# Patient Record
Sex: Female | Born: 1975 | Race: White | Hispanic: No | Marital: Single | Smoking: Never smoker
Health system: Southern US, Community
[De-identification: ages and names within clinical notes are randomized; demographics above are authoritative.]

## PROBLEM LIST (undated history)

## (undated) DIAGNOSIS — F329 Major depressive disorder, single episode, unspecified: Secondary | ICD-10-CM

## (undated) DIAGNOSIS — R7303 Prediabetes: Secondary | ICD-10-CM

## (undated) DIAGNOSIS — T7840XA Allergy, unspecified, initial encounter: Secondary | ICD-10-CM

## (undated) DIAGNOSIS — F32A Depression, unspecified: Secondary | ICD-10-CM

## (undated) DIAGNOSIS — F419 Anxiety disorder, unspecified: Secondary | ICD-10-CM

## (undated) HISTORY — DX: Allergy, unspecified, initial encounter: T78.40XA

## (undated) HISTORY — PX: BUNIONECTOMY: SHX129

## (undated) HISTORY — DX: Major depressive disorder, single episode, unspecified: F32.9

## (undated) HISTORY — DX: Anxiety disorder, unspecified: F41.9

## (undated) HISTORY — DX: Depression, unspecified: F32.A

---

## 1999-01-31 ENCOUNTER — Emergency Department (HOSPITAL_COMMUNITY): Admission: EM | Admit: 1999-01-31 | Discharge: 1999-01-31 | Payer: Self-pay | Admitting: Emergency Medicine

## 2002-01-02 ENCOUNTER — Other Ambulatory Visit: Admission: RE | Admit: 2002-01-02 | Discharge: 2002-01-02 | Payer: Self-pay | Admitting: Gynecology

## 2003-01-22 ENCOUNTER — Other Ambulatory Visit: Admission: RE | Admit: 2003-01-22 | Discharge: 2003-01-22 | Payer: Self-pay | Admitting: Gynecology

## 2004-09-16 ENCOUNTER — Other Ambulatory Visit: Admission: RE | Admit: 2004-09-16 | Discharge: 2004-09-16 | Payer: Self-pay | Admitting: Gynecology

## 2007-08-10 ENCOUNTER — Other Ambulatory Visit: Admission: RE | Admit: 2007-08-10 | Discharge: 2007-08-10 | Payer: Self-pay | Admitting: Gynecology

## 2010-07-02 ENCOUNTER — Encounter: Payer: PRIVATE HEALTH INSURANCE | Attending: Obstetrics and Gynecology

## 2010-07-02 DIAGNOSIS — O99810 Abnormal glucose complicating pregnancy: Secondary | ICD-10-CM | POA: Insufficient documentation

## 2010-07-02 DIAGNOSIS — Z713 Dietary counseling and surveillance: Secondary | ICD-10-CM | POA: Insufficient documentation

## 2010-07-07 ENCOUNTER — Inpatient Hospital Stay (HOSPITAL_COMMUNITY)
Admission: AD | Admit: 2010-07-07 | Discharge: 2010-07-07 | Disposition: A | Discharge status: Home / Self Care | Payer: PRIVATE HEALTH INSURANCE | Source: Ambulatory Visit | Attending: Obstetrics and Gynecology | Admitting: Obstetrics and Gynecology

## 2010-07-07 DIAGNOSIS — O4700 False labor before 37 completed weeks of gestation, unspecified trimester: Secondary | ICD-10-CM | POA: Insufficient documentation

## 2010-07-07 LAB — WET PREP, GENITAL
Clue Cells Wet Prep HPF POC: NONE SEEN
Trich, Wet Prep: NONE SEEN
Yeast Wet Prep HPF POC: NONE SEEN

## 2010-07-07 LAB — URINALYSIS, ROUTINE W REFLEX MICROSCOPIC
Ketones, ur: NEGATIVE mg/dL
Protein, ur: NEGATIVE mg/dL
Urine Glucose, Fasting: NEGATIVE mg/dL
Urobilinogen, UA: 0.2 mg/dL (ref 0.0–1.0)

## 2010-07-07 LAB — URINE MICROSCOPIC-ADD ON

## 2010-07-08 ENCOUNTER — Inpatient Hospital Stay (HOSPITAL_COMMUNITY)
Admission: AD | Admit: 2010-07-08 | Discharge: 2010-07-08 | Disposition: A | Discharge status: Home / Self Care | Payer: PRIVATE HEALTH INSURANCE | Source: Ambulatory Visit | Attending: Obstetrics and Gynecology | Admitting: Obstetrics and Gynecology

## 2010-07-08 DIAGNOSIS — O4700 False labor before 37 completed weeks of gestation, unspecified trimester: Secondary | ICD-10-CM | POA: Insufficient documentation

## 2010-07-09 LAB — URINE CULTURE: Colony Count: 5000

## 2010-07-11 ENCOUNTER — Encounter (HOSPITAL_COMMUNITY): Payer: Self-pay | Admitting: Radiology

## 2010-07-11 ENCOUNTER — Inpatient Hospital Stay (HOSPITAL_COMMUNITY): Payer: PRIVATE HEALTH INSURANCE

## 2010-07-11 ENCOUNTER — Inpatient Hospital Stay (HOSPITAL_COMMUNITY)
Admission: AD | Admit: 2010-07-11 | Discharge: 2010-07-15 | DRG: 781 | Disposition: A | Discharge status: Home / Self Care | Payer: PRIVATE HEALTH INSURANCE | Source: Ambulatory Visit | Attending: Obstetrics and Gynecology | Admitting: Obstetrics and Gynecology

## 2010-07-11 DIAGNOSIS — O4700 False labor before 37 completed weeks of gestation, unspecified trimester: Secondary | ICD-10-CM | POA: Diagnosis present

## 2010-07-11 DIAGNOSIS — O30049 Twin pregnancy, dichorionic/diamniotic, unspecified trimester: Secondary | ICD-10-CM

## 2010-07-11 DIAGNOSIS — O30009 Twin pregnancy, unspecified number of placenta and unspecified number of amniotic sacs, unspecified trimester: Principal | ICD-10-CM | POA: Diagnosis present

## 2010-07-11 DIAGNOSIS — O99891 Other specified diseases and conditions complicating pregnancy: Secondary | ICD-10-CM | POA: Diagnosis present

## 2010-07-11 DIAGNOSIS — O99810 Abnormal glucose complicating pregnancy: Secondary | ICD-10-CM | POA: Diagnosis present

## 2010-07-11 DIAGNOSIS — Z22330 Carrier of Group B streptococcus: Secondary | ICD-10-CM

## 2010-07-11 LAB — GLUCOSE, CAPILLARY: Glucose-Capillary: 109 mg/dL — ABNORMAL HIGH (ref 70–99)

## 2010-07-12 LAB — DIFFERENTIAL
Eosinophils Absolute: 0.1 10*3/uL (ref 0.0–0.7)
Eosinophils Relative: 1 % (ref 0–5)
Lymphs Abs: 1.9 10*3/uL (ref 0.7–4.0)
Monocytes Absolute: 0.7 10*3/uL (ref 0.1–1.0)
Monocytes Relative: 8 % (ref 3–12)
Neutrophils Relative %: 69 % (ref 43–77)

## 2010-07-12 LAB — CBC
MCH: 31.1 pg (ref 26.0–34.0)
MCHC: 33.6 g/dL (ref 30.0–36.0)
MCV: 92.5 fL (ref 78.0–100.0)
Platelets: 232 10*3/uL (ref 150–400)
RBC: 3.73 MIL/uL — ABNORMAL LOW (ref 3.87–5.11)

## 2010-07-12 LAB — URINE MICROSCOPIC-ADD ON

## 2010-07-12 LAB — BASIC METABOLIC PANEL
BUN: 12 mg/dL (ref 6–23)
CO2: 23 mEq/L (ref 19–32)
Calcium: 9.2 mg/dL (ref 8.4–10.5)
Chloride: 105 mEq/L (ref 96–112)
Creatinine, Ser: 0.48 mg/dL (ref 0.4–1.2)

## 2010-07-12 LAB — GLUCOSE, CAPILLARY
Glucose-Capillary: 80 mg/dL (ref 70–99)
Glucose-Capillary: 97 mg/dL (ref 70–99)
Glucose-Capillary: 117 mg/dL — ABNORMAL HIGH (ref 70–99)
Glucose-Capillary: 114 mg/dL — ABNORMAL HIGH (ref 70–99)

## 2010-07-12 LAB — URINALYSIS, ROUTINE W REFLEX MICROSCOPIC
Glucose, UA: NEGATIVE mg/dL
Hgb urine dipstick: NEGATIVE
Ketones, ur: NEGATIVE mg/dL
Protein, ur: NEGATIVE mg/dL

## 2010-07-13 LAB — URINE CULTURE
Colony Count: 5000
Culture  Setup Time: 201203031905
Special Requests: NEGATIVE

## 2010-07-14 LAB — GLUCOSE, CAPILLARY: Glucose-Capillary: 114 mg/dL — ABNORMAL HIGH (ref 70–99)

## 2010-07-15 LAB — GLUCOSE, CAPILLARY
Glucose-Capillary: 75 mg/dL (ref 70–99)
Glucose-Capillary: 84 mg/dL (ref 70–99)

## 2010-07-17 LAB — STREP B DNA PROBE

## 2010-07-21 ENCOUNTER — Inpatient Hospital Stay (HOSPITAL_COMMUNITY)
Admission: AD | Admit: 2010-07-21 | Discharge: 2010-07-21 | Disposition: A | Discharge status: Home / Self Care | Payer: PRIVATE HEALTH INSURANCE | Source: Ambulatory Visit | Attending: Obstetrics and Gynecology | Admitting: Obstetrics and Gynecology

## 2010-07-21 DIAGNOSIS — O4700 False labor before 37 completed weeks of gestation, unspecified trimester: Secondary | ICD-10-CM | POA: Insufficient documentation

## 2010-07-21 LAB — URINALYSIS, ROUTINE W REFLEX MICROSCOPIC
Glucose, UA: NEGATIVE mg/dL
Ketones, ur: NEGATIVE mg/dL
Nitrite: NEGATIVE
Protein, ur: NEGATIVE mg/dL
pH: 5.5 (ref 5.0–8.0)

## 2010-07-21 LAB — URINE MICROSCOPIC-ADD ON

## 2010-08-05 ENCOUNTER — Inpatient Hospital Stay (HOSPITAL_COMMUNITY): Payer: PRIVATE HEALTH INSURANCE

## 2010-08-05 ENCOUNTER — Inpatient Hospital Stay (HOSPITAL_COMMUNITY)
Admission: AD | Admit: 2010-08-05 | Discharge: 2010-08-10 | DRG: 767 | Disposition: A | Discharge status: Home / Self Care | Payer: PRIVATE HEALTH INSURANCE | Source: Ambulatory Visit | Attending: Obstetrics and Gynecology | Admitting: Obstetrics and Gynecology

## 2010-08-05 DIAGNOSIS — O9903 Anemia complicating the puerperium: Secondary | ICD-10-CM | POA: Diagnosis not present

## 2010-08-05 DIAGNOSIS — Z22330 Carrier of Group B streptococcus: Secondary | ICD-10-CM

## 2010-08-05 DIAGNOSIS — O99892 Other specified diseases and conditions complicating childbirth: Secondary | ICD-10-CM | POA: Diagnosis present

## 2010-08-05 DIAGNOSIS — D649 Anemia, unspecified: Secondary | ICD-10-CM | POA: Diagnosis not present

## 2010-08-05 DIAGNOSIS — O4590 Premature separation of placenta, unspecified, unspecified trimester: Principal | ICD-10-CM | POA: Diagnosis present

## 2010-08-05 DIAGNOSIS — O328XX0 Maternal care for other malpresentation of fetus, not applicable or unspecified: Secondary | ICD-10-CM | POA: Diagnosis present

## 2010-08-05 DIAGNOSIS — O30009 Twin pregnancy, unspecified number of placenta and unspecified number of amniotic sacs, unspecified trimester: Secondary | ICD-10-CM | POA: Diagnosis present

## 2010-08-05 DIAGNOSIS — O99814 Abnormal glucose complicating childbirth: Secondary | ICD-10-CM | POA: Diagnosis present

## 2010-08-05 LAB — CBC
HCT: 33.8 % — ABNORMAL LOW (ref 36.0–46.0)
Hemoglobin: 11.5 g/dL — ABNORMAL LOW (ref 12.0–15.0)
MCV: 91.1 fL (ref 78.0–100.0)
RBC: 3.71 MIL/uL — ABNORMAL LOW (ref 3.87–5.11)
WBC: 7.9 10*3/uL (ref 4.0–10.5)

## 2010-08-05 LAB — COMPREHENSIVE METABOLIC PANEL
ALT: 13 U/L (ref 0–35)
Alkaline Phosphatase: 106 U/L (ref 39–117)
BUN: 10 mg/dL (ref 6–23)
CO2: 24 mEq/L (ref 19–32)
Chloride: 105 mEq/L (ref 96–112)
GFR calc non Af Amer: >60 mL/min (ref >60)
Glucose, Bld: 92 mg/dL (ref 70–99)
Potassium: 4 mEq/L (ref 3.5–5.1)
Sodium: 134 mEq/L — ABNORMAL LOW (ref 135–145)
Total Bilirubin: 0.3 mg/dL (ref 0.3–1.2)
Total Protein: 5.1 g/dL — ABNORMAL LOW (ref 6.0–8.3)

## 2010-08-05 LAB — GLUCOSE, CAPILLARY: Glucose-Capillary: 119 mg/dL — ABNORMAL HIGH (ref 70–99)

## 2010-08-05 LAB — AMNISURE RUPTURE OF MEMBRANE (ROM) NOT AT ARMC: Amnisure ROM: NEGATIVE

## 2010-08-06 LAB — GLUCOSE, CAPILLARY: Glucose-Capillary: 86 mg/dL (ref 70–99)

## 2010-08-06 LAB — COMPREHENSIVE METABOLIC PANEL
Alkaline Phosphatase: 105 U/L (ref 39–117)
BUN: 9 mg/dL (ref 6–23)
Creatinine, Ser: 0.56 mg/dL (ref 0.4–1.2)
Glucose, Bld: 69 mg/dL — ABNORMAL LOW (ref 70–99)
Potassium: 4.1 mEq/L (ref 3.5–5.1)
Total Bilirubin: 0.3 mg/dL (ref 0.3–1.2)
Total Protein: 4.6 g/dL — ABNORMAL LOW (ref 6.0–8.3)

## 2010-08-06 LAB — CBC
MCH: 31.1 pg (ref 26.0–34.0)
MCHC: 34.1 g/dL (ref 30.0–36.0)
MCV: 91.2 fL (ref 78.0–100.0)
Platelets: 174 10*3/uL (ref 150–400)
RBC: 3.63 MIL/uL — ABNORMAL LOW (ref 3.87–5.11)

## 2010-08-07 LAB — GLUCOSE, CAPILLARY: Glucose-Capillary: 66 mg/dL — ABNORMAL LOW (ref 70–99)

## 2010-08-08 ENCOUNTER — Other Ambulatory Visit: Payer: Self-pay | Admitting: Obstetrics and Gynecology

## 2010-08-08 ENCOUNTER — Inpatient Hospital Stay (HOSPITAL_COMMUNITY): Payer: PRIVATE HEALTH INSURANCE

## 2010-08-08 LAB — CBC
MCH: 31.2 pg (ref 26.0–34.0)
MCV: 91 fL (ref 78.0–100.0)
Platelets: 196 10*3/uL (ref 150–400)
RDW: 12.8 % (ref 11.5–15.5)

## 2010-08-09 LAB — CBC
HCT: 20.2 % — ABNORMAL LOW (ref 36.0–46.0)
MCH: 31.1 pg (ref 26.0–34.0)
MCHC: 34.2 g/dL (ref 30.0–36.0)
RDW: 12.8 % (ref 11.5–15.5)

## 2010-08-10 LAB — CBC
MCH: 30.4 pg (ref 26.0–34.0)
MCHC: 34.5 g/dL (ref 30.0–36.0)
MCV: 88.1 fL (ref 78.0–100.0)
Platelets: 172 10*3/uL (ref 150–400)
RDW: 14.5 % (ref 11.5–15.5)
WBC: 11.4 10*3/uL — ABNORMAL HIGH (ref 4.0–10.5)

## 2010-08-11 LAB — CROSSMATCH
ABO/RH(D): A POS
Unit division: 0
Unit division: 0

## 2010-08-12 ENCOUNTER — Encounter (HOSPITAL_COMMUNITY)
Admission: RE | Admit: 2010-08-12 | Discharge: 2010-08-12 | Disposition: A | Discharge status: Home / Self Care | Payer: PRIVATE HEALTH INSURANCE | Source: Ambulatory Visit | Attending: Obstetrics and Gynecology | Admitting: Obstetrics and Gynecology

## 2010-08-12 DIAGNOSIS — O923 Agalactia: Secondary | ICD-10-CM | POA: Insufficient documentation

## 2010-08-13 NOTE — Op Note (Signed)
NAME:  Suzanne Hanson, Suzanne Hanson NO.:  [id number]  MEDICAL RECORD NO.:  [id number]           PATIENT TYPE:  I  LOCATION:  9305                          FACILITY:  [facility]  PHYSICIAN:  Janine Limbo, M.D.DATE OF BIRTH:  04-Oct-1975  DATE OF PROCEDURE:  08/08/2010 DATE OF DISCHARGE:                              OPERATIVE REPORT   PREOPERATIVE DIAGNOSES: 1. A 34-week and 2-day gestation. 2. Twin gestation. 3. Vertex-vertex presentation. 4. Triplet reduction to twins. 5. Gestational diabetes. 6. Preterm labor. 7. Positive beta strep culture.  POSTOPERATIVE DIAGNOSES: 1. A 34-week and 2-day gestation. 2. Twin gestation. 3. Vertex-vertex presentation. 4. Triplet reduction to twins. 5. Gestational diabetes. 6. Preterm labor. 7. Positive beta strep culture. 8. Placental abruption. 9. Compound presentation of twin B. 10.Third-degree laceration. 11.Questionable retained products of conception.  PROCEDURE:1. Normal spontaneous vaginal delivery of a twin gestation. 2. Curettage with ultrasound guidance. 3. Repair of third-degree laceration.  SURGEON:  Janine Limbo, MD  FIRST ASSISTANT:  Sanda Klein, certified nurse midwife.  ANESTHETIC:  Spinal and local Xylocaine.  DISPOSITION:  The patient is a 35 year old female, gravida 1, para 0, who was admitted on August 05, 2010, with preterm labor symptoms.  The patient has been followed at the [facility] Gy[facility]cology [facility] of [facility] for women.  Her diagnoses are mentioned above in the preop section of this note.  The patient began to labor on July 22, 2010.  The patient's membranes were ruptured when she was 8 cm dilated.  She was noted to have meconium-stained amniotic fluid.  A long discussion was held with the patient and her partner about the vaginal delivery of twins.  The decision was made to deliver the baby's in the operating room for safety reasons.  We discussed the  possibility that a cesarean section may be required if the second infant was in any position other than vertex.  We also discussed the possibility of cesarean section if there was evidence of fetal stress.  When the patient became completely dilated, the patient was transferred to the operating room for delivery.  FINDINGS:  The first infant delivered was a 2078 grams female infant (Silus).  The Apgar scores were 8 at 1  minute and 8 at 5 minutes.  That infant was delivered from a vertex presentation.  The second infant delivered was a 2222 grams female infant Sales executive).  That infant was delivered from a compound presentation with the fetal hand presenting above the head.  The Apgar scores for that infant was 5 at 1 minute and 7 at 5 minutes.  The arterial cord blood pH was 6.92.  The venous cord blood pH was 7.05.  There was a placental abruption that we believe occurred between the delivery of the 2 infants.  The placenta was sent to pathology.  The patient was noted to have a third-degree laceration. Because the patient had a fetal reduction from triplets to twins, an ultrasound was obtained in the operating suite.  There was a question of retained material in addition to blood clots.  On uterine curettage, only blood clot and membranous tissue was  recovered.  PROCEDURE:  The patient was taken to the operating room for delivery of twins.  The patient was prepped and draped.  The mother was allowed to push until she successfully delivered the fetal head of twin A.  The mouth and nose were suctioned.  Remainder of the infant was delivered. The cord was clamped and cut, and infant was handed to the awaiting pediatric team.  Routine cord blood studies were obtained.  An ultrasound was performed and the second infant was noted to be in a vertex presentation.  Fetal heart tones were stable for the second infant.  The patient was allowed to push.  Once the fetal head was engaged in the pelvis,  the membranes were ruptured.  The amniotic fluid was clear for this infant.  The fetal hand was noted to be presenting above the fetal head.  The patient began having bleeding.  A fetal scalp electrode was placed and after a brief episode of bradycardia, the fetal heart tones stabilized.  The patient was allowed to push and the fetal hand retracted.  There was a question of a prolapsed cord, but with further evaluation, it was felt to be the cord from twin A.  The mother then successfully delivered twin B from a vertex presentation.  The cord was clamped and cut and the infant was handed to the waiting pediatric team.  Routine cord blood studies were obtained from twin B including blood gas studies.  The perineum was prepped and then the rectal sphincter was then held using Allis clamps.  Three interrupted sutures of 2-0 Vicryl were placed.  The perineal body and the fascia propria of the perineum were then closed using interrupted sutures of 2-0 Vicryl. The vaginal mucosa was closed using a running suture of 2-0 Vicryl and the perineum was closed using a subcuticular suture of 2-0 Vicryl.  An ultrasound was performed and there was a question of retained products. The decision was made to proceed with uterine curettage while the ultrasound team was in the delivery area.  For that reason, we elected to proceed with a spinal anesthetic for the patient comfort.  The patient was evaluated by the Anesthesia Department and appropriate consent was obtained.  A spinal anesthetic was given.  The patient's perineum and vagina was then prepped with multiple layers of Betadine and the patient was sterilely draped.  The operator changed gown and gloves.  A rectal exam was performed and there was noted to be a "button hole" at the capsule of the rectal sphincter.  The sutures were then cut.  The operator changed gloves.  The uterine curettage was performed using a banjo curette.  This was done on the  ultrasound guidance.  The cavity was noted to be clean at the end of our procedure.  Very little tissue was obtained.  Blood clots and membranes were removed.  We then used Allis clamps to once again hold the rectal sphincter muscle.  Three interrupted sutures of 2-0 Vicryl were placed.  The perineal body was then closed in layers and reinforced using interrupted sutures of 2-0 Vicryl.  The vaginal mucosa was closed using a running suture and locking suture of 2-0 Vicryl.  The perineal skin was closed using 3-0 Monocryl.  Hemostasis was noted to be adequate.  The patient was given 1 gram of Ancef.  A rectal exam was performed.  In this time, the perineal body was noted to be well approximated and there was no evidence of the "button  hole" that was previously detected.  The patient was returned to the supine position.  She was transported to the recovery room in stable condition.  The bladder was drained of urine prior to the curettage. Sponge and needle counts were correct.  The estimated blood loss for the entire procedure including the delivery was 600 mL.  The infants were taken to the neonatal intensive care nursery for observation.  The twin placenta and the curettings were sent to pathology for evaluation.     Janine Limbo, M.D.     AVS/MEDQ  D:  08/08/2010  T:  08/09/2010  Job:  [id number]  Electronically Signed by Kirkland Hun M.D. on 08/13/2010 11:16:49 AM

## 2010-08-25 NOTE — Discharge Summary (Signed)
NAME:  Suzanne Hanson, Suzanne Hanson NO.:  [id number]  MEDICAL RECORD NO.:  [id number]           PATIENT TYPE:  I  LOCATION:  9152                          [facility]ILITY:  [facility]  PHYSICIAN:  Hal Morales, M.D.DATE OF BIRTH:  [birth year]  DATE OF ADMISSION:  07/11/2010 DATE OF DISCHARGE:  07/15/2010                              DISCHARGE SUMMARY   CONSULTATIONS:  NICU.  PROCEDURES:  Ultrasound on July 11, 2010, to assess AFI and presentation for twins.  ADMITTING DIAGNOSES: 1. Intrauterine pregnancy at 30 weeks. 2. Dichorionic-diamniotic twins. 3. Preterm cervical changes. 4. Positive GBS. 5. History of positive fetal fibronectin. 6. Gestational diabetes, diet controlled. 7. Status post betamethasone course x2 after positive fetal     fibronectin recently.  DISCHARGE DIAGNOSES: 1. Intrauterine pregnancy at 31 weeks. 2. Dichorionic-diamniotic twins. 3. Preterm cervical changes and dilatation. 4. Contractions stable on Procardia 10 mg p.o. q.4 h around the clock. 5. The patient's mom flying in from [place] on the evening of     discharge to help pt at home with bedrest.  LABORATORY DATA:  The patient had a CBC with diff drawn on July 12, 2010, white count was normal at 8.7, hemoglobin 11.6, hematocrit 34.5, and platelets were 232.  Differential was within normal limits.  She also had routine chemistries drawn, and was within normal limits. Urinalysis was within normal limits with the exception of small leukocyte esterase & few epithelials on micro urinalysis.  Urine culture which was done on July 12, 2010, showed insignificant growth with 5000 colonies.  GBS of vaginal/rectal specimen was positive.  The patient is gestational diabetic and diet controlled.  CBGs were as followed:  She had fastings as well as 2-hour postprandials.  On the evening of July 11, 2010, at 2018 postprandial was 109.  On July 12, 2010, fasting at 7:21 a.m. was 80, 10:32 a.m. 97, 1451 of 117,  1945 of 114.  On July 13, 2010, at 07:26 fasting was 95 at 11:35 of 97, at 1718 of 109 and all of these were within normal limits.  On July 13, 2010, at 1943 of 110.  On July 14, 2010, fasting at 8:10 was 86, 11:05 equal to 94, at 1611 CBG was 117 and at 2109 it was 114, and on July 15, 2010, 09:20 a.m. fasting was 75 at 11:51 a.m. it was 84.  These remained all within normal limits despite the patient being recently status post betamethasone course.  HOSPITAL COURSE:  Ms. Bouie is a 35 year old gravida 1, para 0 who presented on the date of admission at 30 weeks and 3 days as a direct admit for early cervical changes; complained of some occasional cramps, no vaginal bleeding, good fetal activity x2, intact membranes.  Denied GI or respiratory complaints.  No PIH or UTI signs or symptoms.  She was a transfer of care from [facility] at 28 weeks, and pregnancy remarkable for: 1. IVF pregnancy, had started as a triplet pregnancy and did have induced     reduction to twins. 2. Di-di twins. 3. Anxiety and depression and on Celexa--stable on 30 mg p.o. daily. 4. Gestational diabetes, diet  controlled. 5. Positive GBS.  On admission, the patient's vital signs were stable.  Speculum exam and cervical exam were deferred.  She did have reactive fetal heart tracing x2, baby A was running in 130s and baby B 140s, had moderate variability x2, both with an occasional mild variable.  She did note some uterine irritability with the occasional contraction. Consult was made with Dr. Jaymes Graff.  The patient was admitted for bedrest and to begin Procardia for preterm contractions and continued observation for possible preterm labor.  At 11:00 a.m. on July 11, 2010, the patient was without complaints.  Toco at that point showed one to seven contractions per hour.  Procardia had been started at 10mg  p.o. q.6 h with continuous monitoring. Plan was to monitor closely. On July 13, 2010, at 2:30 p.m., the patient continued to be without complaints.  No leakage of fluid, no vaginal bleeding.  Toco showed contractions were 5-7 per hour. Cervix on that day was 3 cm, 80% effaced, and minus 1 station.  She did have VTE prophylaxis beginning on the date of admission which was July 11, 2010.  The patient had an ultrasound to assess the AFI x2 as well as presentation x2.  On that day baby A: AFI was subjectively within normal, largest pocket was 7.5 cm and cephalic presentation and was the lower fetus.  Heart rate 140.  Baby B had a heart rate of 142.  AFI subjectively within normal limits with largest pocket equal to 10.1 cm and baby B was transverse with head to maternal left.  Cervical length 0 cm and appeared dilated 2.5 cm, adnexa with no abnormalities visualized. On July 13, 2010, Dr. Normand Sloop did note that the patient was stable, so IV was heplocked.  She had been receiving some IV hydration and also had been on a carbohydrate modified diet.  Dr. Normand Sloop did order that the patient be allowed to go to childbirth class later on that day if remained stable.  On morning of July 14, 2010, on morning rounds, the patient was doing well.  She was aware of some mild cramping, positive fetal movement x2, no leakage of fluid or vaginal bleeding. Vital signs were stable.  Both fetal heart rates remained reactive, one variable noted with baby A that day.  Her contractions were approximately every 8 minutes throughout the night with some irritability.  Physical exam was within normal limits.  The patient remained on her Procardia at that time every 6 hours.  As the day progressed however, Dr. Estanislado Pandy did order to increase the Procardia to 10 mg p.o. every 4 hours, and that change occurred around 1305 p.m. when the patient had reported an increase in uterine activity.   On the morning of July 15, 2010, just before lunch, the patient was at 60 weeks' gestation.  She was without  complaints.  No increase in contractions, no vaginal bleeding or leakage of fluid, no increase in vaginal/rectal pressure, had good fetal movement x2, reported typically feeling 2 contractions per hour with some cramping, no lower extremity complaints.  No GI or respiratory complaints.  Vital signs were stable. She was afebrile.  CBGs as I mentioned earlier were all within normal limits.  Baby A was reactive.  No decels, moderate variability, running a baseline of 130.  Baby B 135 and reactive, moderate variability, no decels.  She typically had 2 -4 contractions per hour with occasional uterine irritability.  Her physical exam was within normal limits.   Cervical  exam on that morning of the 6th (March) was 3 cm 100% effaced, -1 station, vertex, bulging membranes behind the cervix.  Cervix was noted to be towards the patient's left, mid to posterior position; GBS culture was obtained at that time, (vaginal/rectal swab). After consultation with Dr. Dierdre Forth, as the morning progressed, Dr. Pennie Rushing did deem the patient to have received full benefit of the hospital stay and did discharge the patient home on full bedrest and in stable condition.  Preterm labor signs and symptoms were reviewed with the patient as well as any warning signs and symptoms to report including signs and symptoms of DVT, leakage of fluid, vaginal bleeding.  She was also instructed on fetal kick counts, and to follow up p.r.n. She was discharged home with a scheduled followup on July 17, 2010, at [facility].  DISCHARGE MEDICATIONS:  She was to continue the Procardia 10 mg p.o. q.4 hrs as well as her previous home meds which included the Celexa, one mg of folic acid, prenatal vitamin, and Tums as needed.     Candice Denny Levy, CNM   ______________________________ Hal Morales, M.D.    CHS/MEDQ  D:  08/16/2010  T:  08/16/2010  Job:  [id number]  Electronically Signed by Carolanne Grumbling CNM on 08/19/2010 09:44:57 PM Electronically Signed by Dierdre Forth M.D. on 08/24/2010 11:55:38 AM

## 2010-09-12 ENCOUNTER — Encounter (HOSPITAL_COMMUNITY)
Admission: RE | Admit: 2010-09-12 | Discharge: 2010-09-12 | Disposition: A | Discharge status: Home / Self Care | Payer: PRIVATE HEALTH INSURANCE | Source: Ambulatory Visit | Attending: Obstetrics and Gynecology | Admitting: Obstetrics and Gynecology

## 2010-09-12 DIAGNOSIS — O923 Agalactia: Secondary | ICD-10-CM | POA: Insufficient documentation

## 2010-10-20 NOTE — Discharge Summary (Signed)
NAME:  Suzanne Hanson, Suzanne Hanson NO.:  [id number]  MEDICAL RECORD NO.:  [id number]           PATIENT TYPE:  I  LOCATION:  9305                          [facility]  PHYSICIAN:  Janine Limbo, M.D.DATE OF BIRTH:  June 15, 1975  DATE OF ADMISSION:  08/05/2010 DATE OF DISCHARGE:  08/10/2010                              DISCHARGE SUMMARY   ADMISSION DIAGNOSES: 1. A 34-2-week intrauterine twin gestation, vertex-vertex     presentation. 2. Triplet pregnancy reduced to twins. 3. Gestational diabetes. 4. Preterm labor. 5. Group B streptococcus positive.  DISCHARGE DIAGNOSES: 1. A 34-2-week intrauterine twin gestation, vertex-vertex     presentation. 2. Triple pregnancy reduced to twins. 3. Gestational diabetes. 4. Preterm labor. 5. Group B streptococcus positive. 6. Placental abruption. 7. Compound presentation of twin B. 8. Third-degree laceration. 9. Questionable retained products of conception.  The patient was admitted on August 05, 2010, at 33 weeks and 6 days with twins for preterm labor.  Cervix was 4 cm, 100 and zero station.  She had received betamethasone series in February.  The patient's contractions stabilized on August 06, 2010, and August 07, 2010, on IV fluids and p.o. Procardia.  On August 06, 2010, fasting blood sugar was 74, 2-hour postprandials were between 96 and 119.  Fetal heart rates were categories 1 and 2.  On August 07, 2010, fasting blood sugar was 66, 2-hour postprandials were between 100 and 118.  Fetal heart rates were category 1.  On the night of August 07, 2010, the patient's contractions increased despite 2 rescue doses of Procardia.  By 3:40 a.m. on August 08, 2010, her cervix was 6, 100%, zero station, bulging bag.  She was transferred to Labor and Delivery for labor.  Vertex-vertex presentation was verified by bedside ultrasound.  At 5:35 a.m., she progressed to 6-7 cm, +1 station.  The patient was transferred to the operating room  for delivery in the event that C-section became necessary.    Baby A was delivered at 11:02 a.m., he was a 2078 g female without Apgars 8 and 8. He was delivered vertex presentation.  Baby B was born at 11:39 with a compound presenting fetal hand above the head, Apgars were 5 and 7. Shortly before delivery of baby B, the patient began having vaginal bleeding.  There was a brief episode of bradycardia.  Fetal scalp electrode was placed to better assess fetal heart rate.  The heart rate stabilized.  The patient pushed a few more times and delivered twin B. Dr. Stefano Gaul felt that the bleeding episode was due to an abruption between the birth of twin A and twin B.  After delivery, the patient was found to have a third-degree laceration which was repaired in the usual fashion.  He then became concerned that there were retained products of conception.  Decision was made to proceed with urine curettaged with ultrasound guidance.  The patient received spinal anesthesia.  Little tissue was obtained with curettage.  Blood clots and membranes were removed.  A third-degree repair was then assessed.  A buttonhole opening was found in the capsule of the rectal sphincter, so the third-degree repair  was removed and repeated at this time with a good closure.  The patient had a routine recovery and was sent to [facility] for routine postpartum care.    On postpartum day 1, the patient was doing well.  She denied dizziness.  Her hemoglobin had dropped to 6.9.  She was offered blood transfusion, but declined.  A few hours later, she reconsidered stating that she felt very tired.  She received 3 units of packed red cells that afternoon.  Following morning on postpartum day 2, she reported feeling much better.  She denied dizziness.  Her hemoglobin risen to 10.2.  She was pumping breast milk.  She was discharged home on August 10, 2010, in stable condition.  Discharge followup in 5-6 weeks at [facility].  DISCHARGE MEDICATIONS:  Ibuprofen.  DISCHARGE INSTRUCTIONS:  Per [facility] handout.  Labs during her stay were as follows:  On August 05, 2010, CBC showed white blood cell count of 7.9, hemoglobin 11.5, hematocrit 33.8, platelets of 190.  On August 06, 2010, CBC showed white blood cell count of 7.2, hemoglobin 11.3, hematocrit 33.1, and platelets 174.  On August 08, 2010, this was postdelivery, white blood cell count was 19.3, hemoglobin 9.7, hematocrit 28.3, and platelets 196.  On August 09, 2010, white blood cell count was 12.6, hemoglobin 6.9, hematocrit 20.2, and platelets 148.  On August 10, 2010, white blood cell count was 11.4, hemoglobin 10.2, hematocrit 29.6, and platelets 172.  On August 05, 2010, basic metabolic panel was essentially normal with a slightly low sodium of 134 and on August 06, 2010, basic metabolic panel also essentially normal with slightly low glucose of 69.  On postpartum day 1, fasting glucose was 92.  Ultrasound on August 05, 2010, showed twin A cephalic presentation, placenta right lateral above cervical os.  No placental abruption or previa identified.  Normal fluid.  BPP 8/8.  Baby B transverse with head to maternal right, placenta right lateral above cervical os.  No placental abruption or previa identified.  Fluid normal.  BPP 8/8.  On August 08, 2010, ultrasound showed baby A cephalic presentation, normal fluid, baby B cephalic presentation, normal fluid.     Dorathy Kinsman, CNM   ______________________________ Janine Limbo, M.D.    VS/MEDQ  D:  09/30/2010  T:  09/30/2010  Job:  [id number]  Electronically Signed by Dorathy Kinsman  on 10/17/2010 04:04:05 PM Electronically Signed by Kirkland Hun M.D. on 10/20/2010 03:45:06 PM

## 2012-04-05 ENCOUNTER — Ambulatory Visit (INDEPENDENT_AMBULATORY_CARE_PROVIDER_SITE_OTHER): Payer: BC Managed Care – PPO | Admitting: Family Medicine

## 2012-04-05 VITALS — BP 107/67 | HR 72 | Temp 98.4°F | Resp 16 | Ht 63.5 in | Wt 148.6 lb

## 2012-04-05 DIAGNOSIS — F329 Major depressive disorder, single episode, unspecified: Secondary | ICD-10-CM

## 2012-04-05 DIAGNOSIS — F419 Anxiety disorder, unspecified: Secondary | ICD-10-CM

## 2012-04-05 DIAGNOSIS — F411 Generalized anxiety disorder: Secondary | ICD-10-CM

## 2012-04-05 DIAGNOSIS — F32A Depression, unspecified: Secondary | ICD-10-CM

## 2012-04-05 DIAGNOSIS — F3289 Other specified depressive episodes: Secondary | ICD-10-CM

## 2012-04-05 MED ORDER — CITALOPRAM HYDROBROMIDE 10 MG PO TABS: 30.0000 mg | ORAL_TABLET | Freq: Every day | ORAL | Status: DC

## 2012-04-05 NOTE — Progress Notes (Signed)
[facility]:  Office Visit  Chief Complaint:  Chief Complaint  Patient presents with  . Medication Refill    celexa    HPI: Suzanne Hanson is a 36 y.o. female who complains of  celexa refill, has been on it for 7 years. Still looking for work. 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 Has 33 mos old twins, still nursing. Has been cleared with OB that celexa is ok for  lacatation.  No s/sx of depression. No SI/HI.   Past Medical History  Diagnosis Date  . Allergy   . Depression   . Diabetes mellitus without complication     gestational  . Anxiety    Past Surgical History  Procedure Date  . Bunionectomy    History   Social History  . Marital Status: Single    Spouse Name: N/A    Number of Children: N/A  . Years of Education: N/A   Social History Main Topics  . Smoking status: Never Smoker   . Smokeless tobacco: None  . Alcohol Use: Yes  . Drug Use: No  . Sexually Active: Yes   Other Topics Concern  . None   Social History Narrative  . None   Family History  Problem Relation Age of Onset  . Osteoporosis Mother   . Post-traumatic stress disorder Father   . Drug abuse Sister   . Epilepsy Brother   . Food Allergy Son   . Arthritis Maternal Grandmother   . Osteoporosis Maternal Grandmother   . Graves' disease Maternal Grandfather   . Cancer Maternal Grandfather   . Diabetes Paternal Grandfather   . Cancer Paternal Grandfather    No Known Allergies Prior to Admission medications   Medication Sig Start Date End Date Taking? Authorizing Provider  citalopram (CELEXA) 10 MG tablet Take 30 mg by mouth daily.   Yes Historical Provider, MD     ROS: The patient denies fevers, chills, night sweats, unintentional weight loss, chest pain, palpitations, wheezing, dyspnea on exertion, nausea, vomiting, abdominal pain, dysuria, hematuria, melena, numbness,  weakness, or tingling.  All other systems have been reviewed and were otherwise negative with the exception of those mentioned in the HPI and as above.    PHYSICAL EXAM: Filed Vitals:   04/05/12 1246  BP: 107/67  Pulse: 72  Temp: 98.4 F (36.9 C)  Resp: 16   Filed Vitals:   04/05/12 1246  Height: 5' 3.5" (1.613 m)  Weight: 148 lb 9.6 oz (67.405 kg)   Body mass index is 25.91 kg/(m^2).  General: Alert, no acute distress HEENT:  Normocephalic, atraumatic, oropharynx patent.  Cardiovascular:  Regular rate and rhythm, no rubs murmurs or gallops.  No Carotid bruits, radial pulse intact. No pedal edema.  Respiratory: Clear to auscultation bilaterally.  No wheezes, rales, or rhonchi.  No cyanosis, no use of accessory musculature GI: No organomegaly, abdomen is soft and non-tender, positive bowel sounds.  No masses. Skin: No rashes. Neurologic: Facial musculature symmetric. Psychiatric: Patient is appropriate throughout our interaction. Lymphatic: No cervical lymphadenopathy Musculoskeletal: Gait intact.   LABS: Results for orders placed during the hospital encounter of 08/05/10  AMNISURE RUPTURE OF MEMBRANE (ROM)      Component Value Range   Amnisure ROM NEGATIVE    GLUCOSE, CAPILLARY      Component Value Range   Glucose-Capillary 105 (*) 70 - 99 mg/dL  Comment 1 Notify RN     Comment 2 Documented in Chart    CBC      Component Value Range   WBC 7.9  4.0 - 10.5 K/uL   RBC 3.71 (*) 3.87 - 5.11 MIL/uL   Hemoglobin 11.5 (*) 12.0 - 15.0 g/dL   HCT 16.1 (*) 09.6 - 04.5 %   MCV 91.1  78.0 - 100.0 fL   MCH 31.0  26.0 - 34.0 pg   MCHC 34.0  30.0 - 36.0 g/dL   RDW 40.9  81.1 - 91.4 %   Platelets 190  150 - 400 K/uL  COMPREHENSIVE METABOLIC PANEL      Component Value Range   Sodium 134 (*) 135 - 145 mEq/L   Potassium 4.0  3.5 - 5.1 mEq/L   Chloride 105  96 - 112 mEq/L   CO2 24  19 - 32 mEq/L   Glucose, Bld 92  70 - 99 mg/dL   BUN 10  6 - 23 mg/dL   Creatinine, Ser 7.82   0.4 - 1.2 mg/dL   Calcium 8.9  8.4 - 95.6 mg/dL   Total Protein 5.1 (*) 6.0 - 8.3 g/dL   Albumin 2.3 (*) 3.5 - 5.2 g/dL   AST 18  0 - 37 U/L   ALT 13  0 - 35 U/L   Alkaline Phosphatase 106  39 - 117 U/L   Total Bilirubin 0.3  0.3 - 1.2 mg/dL   GFR calc non Af Amer >60  >60 mL/min   GFR calc Af Amer    >60 mL/min   Value: >60            The eGFR has been calculated     using the MDRD equation.     This calculation has not been     validated in all clinical     situations.     eGFR's persistently     <60 mL/min signify     possible Chronic Kidney Disease.  GLUCOSE, CAPILLARY      Component Value Range   Glucose-Capillary 119 (*) 70 - 99 mg/dL   Comment 1 Notify RN     Comment 2 Documented in Chart    CBC      Component Value Range   WBC 7.2  4.0 - 10.5 K/uL   RBC 3.63 (*) 3.87 - 5.11 MIL/uL   Hemoglobin 11.3 (*) 12.0 - 15.0 g/dL   HCT 21.3 (*) 08.6 - 57.8 %   MCV 91.2  78.0 - 100.0 fL   MCH 31.1  26.0 - 34.0 pg   MCHC 34.1  30.0 - 36.0 g/dL   RDW 46.9  62.9 - 52.8 %   Platelets 174  150 - 400 K/uL  GLUCOSE, CAPILLARY      Component Value Range   Glucose-Capillary 74  70 - 99 mg/dL  COMPREHENSIVE METABOLIC PANEL      Component Value Range   Sodium 136  135 - 145 mEq/L   Potassium 4.1  3.5 - 5.1 mEq/L   Chloride 108  96 - 112 mEq/L   CO2 22  19 - 32 mEq/L   Glucose, Bld 69 (*) 70 - 99 mg/dL   BUN 9  6 - 23 mg/dL   Creatinine, Ser 4.13  0.4 - 1.2 mg/dL   Calcium 8.7  8.4 - 24.4 mg/dL   Total Protein 4.6 (*) 6.0 - 8.3 g/dL   Albumin 2.2 (*) 3.5 - 5.2 g/dL   AST 16  0 - 37 U/L   ALT 14  0 - 35 U/L   Alkaline Phosphatase 105  39 - 117 U/L   Total Bilirubin 0.3  0.3 - 1.2 mg/dL   GFR calc non Af Amer >60  >60 mL/min   GFR calc Af Amer    >60 mL/min   Value: >60            The eGFR has been calculated     using the MDRD equation.     This calculation has not been     validated in all clinical     situations.     eGFR's persistently     <60 mL/min signify      possible Chronic Kidney Disease.  GLUCOSE, CAPILLARY      Component Value Range   Glucose-Capillary 86  70 - 99 mg/dL   Comment 1 Notify RN     Comment 2 Documented in Chart    GLUCOSE, CAPILLARY      Component Value Range   Glucose-Capillary 100 (*) 70 - 99 mg/dL   Comment 1 Notify RN     Comment 2 Documented in Chart    GLUCOSE, CAPILLARY      Component Value Range   Glucose-Capillary 118 (*) 70 - 99 mg/dL   Comment 1 Notify RN     Comment 2 Documented in Chart    GLUCOSE, CAPILLARY      Component Value Range   Glucose-Capillary 66 (*) 70 - 99 mg/dL  GLUCOSE, CAPILLARY      Component Value Range   Glucose-Capillary 111 (*) 70 - 99 mg/dL   Comment 1 Notify RN     Comment 2 Documented in Chart    GLUCOSE, CAPILLARY      Component Value Range   Glucose-Capillary 108 (*) 70 - 99 mg/dL  GLUCOSE, CAPILLARY      Component Value Range   Glucose-Capillary 101 (*) 70 - 99 mg/dL   Comment 1 Notify RN    GLUCOSE, CAPILLARY      Component Value Range   Glucose-Capillary 88  70 - 99 mg/dL  GLUCOSE, CAPILLARY      Component Value Range   Glucose-Capillary 99  70 - 99 mg/dL  CBC      Component Value Range   WBC 19.3 (*) 4.0 - 10.5 K/uL   RBC 3.11 (*) 3.87 - 5.11 MIL/uL   Hemoglobin 9.7 (*) 12.0 - 15.0 g/dL   HCT 16.1 (*) 09.6 - 04.5 %   MCV 91.0  78.0 - 100.0 fL   MCH 31.2  26.0 - 34.0 pg   MCHC 34.3  30.0 - 36.0 g/dL   RDW 40.9  81.1 - 91.4 %   Platelets 196  150 - 400 K/uL  CBC      Component Value Range   WBC 12.6 (*) 4.0 - 10.5 K/uL   RBC 2.22 (*) 3.87 - 5.11 MIL/uL   Hemoglobin   (*) 12.0 - 15.0 g/dL   Value: 6.9 DELTA CHECK NOTED REPEATED TO VERIFY CRITICAL RESULT CALLED TO, READ BACK BY AND VERIFIED WITH: A RHODES @ 0553 ON 08/09/11 BY K HUFFMAN   HCT 20.2 (*) 36.0 - 46.0 %   MCV 91.0  78.0 - 100.0 fL   MCH 31.1  26.0 - 34.0 pg   MCHC 34.2  30.0 - 36.0 g/dL   RDW 78.2  95.6 - 21.3 %   Platelets   (*) 150 - 400 K/uL   Value: 148  DELTA CHECK NOTED SPECIMEN CHECKED  FOR CLOTS REPEATED TO VERIFY  GLUCOSE, CAPILLARY      Component Value Range   Glucose-Capillary 92  70 - 99 mg/dL  CROSSMATCH      Component Value Range   ABO/RH(D) A POS     Antibody Screen NEG     Sample Expiration 08/12/2010     Unit Number [id number]     Blood Component Type RED CELLS,LR     Unit division 00     Status of Unit ISSUED,FINAL     Transfusion Status OK TO TRANSFUSE     Crossmatch Result Compatible     Unit Number [id number]     Blood Component Type RED CELLS,LR     Unit division 00     Status of Unit ISSUED,FINAL     Transfusion Status OK TO TRANSFUSE     Crossmatch Result Compatible     Unit Number [id number]     Blood Component Type RED CELLS,LR     Unit division 00     Status of Unit ISSUED,FINAL     Transfusion Status OK TO TRANSFUSE     Crossmatch Result Compatible    ABO/RH      Component Value Range   ABO/RH(D) A POS    CBC      Component Value Range   WBC 11.4 (*) 4.0 - 10.5 K/uL   RBC 3.36 (*) 3.87 - 5.11 MIL/uL   Hemoglobin   (*) 12.0 - 15.0 g/dL   Value: 59.5 DELTA CHECK NOTED REPEATED TO VERIFY POST TRANSFUSION   HCT 29.6 (*) 36.0 - 46.0 %   MCV 88.1  78.0 - 100.0 fL   MCH 30.4  26.0 - 34.0 pg   MCHC 34.5  30.0 - 36.0 g/dL   RDW 63.8  75.6 - 43.3 %   Platelets 172  150 - 400 K/uL     EKG/XRAY:   Primary read interpreted by Dr. Conley Rolls at [facility].   ASSESSMENT/PLAN: Encounter Diagnoses  Name Primary?  . Depression Yes  . Anxiety    Depressive sxs well controlled with celexa. Benefits outweigh risk. She has 76 month old twins. Denies any SI/HI. Refilled celexa 30 mg daily #5 Patient is aware that we will not refill prescription unless she returns in 6 months. Need to establish continuity before rx 12 months at a time.  F/u in 6 months or prn   Clements Toro PHUONG, DO 04/05/2012 1:16 PM

## 2012-04-28 ENCOUNTER — Ambulatory Visit (INDEPENDENT_AMBULATORY_CARE_PROVIDER_SITE_OTHER): Payer: BC Managed Care – PPO | Admitting: Emergency Medicine

## 2012-04-28 ENCOUNTER — Ambulatory Visit: Payer: BC Managed Care – PPO

## 2012-04-28 VITALS — BP 110/70 | HR 73 | Temp 98.4°F | Resp 16 | Ht 63.0 in | Wt 143.0 lb

## 2012-04-28 DIAGNOSIS — M25571 Pain in right ankle and joints of right foot: Secondary | ICD-10-CM

## 2012-04-28 DIAGNOSIS — M25579 Pain in unspecified ankle and joints of unspecified foot: Secondary | ICD-10-CM

## 2012-04-28 DIAGNOSIS — Z23 Encounter for immunization: Secondary | ICD-10-CM

## 2012-04-28 NOTE — Progress Notes (Signed)
   [address], [place] [address]   Phone [phone]  Subjective:    Patient ID: Suzanne Hanson, female    DOB: 1975-09-04, 36 y.o.   MRN: [id number]  HPI  Pt presents to clinic with R ankle pain for about 2 wks.  She runs about 6 miles a day and only had pain when she pushed on the area until Sunday  when she ran 7 miles then she started to develop constant pain and increased pain with ambulation. Prior to her run she had no injury to explain her pain. She tried to wear a short camwalker but it did not help the pain.  She is worried about a stress fracture.  She had one in her foot from running and this feels similar.  She has taken no meds.  She is not worried as much about the pain as she is about a possible fracture getting worse or not healing. Pt would like a flu vaccine. She is breastfeeding twins (74 months old)  Review of Systems  Musculoskeletal: Positive for gait problem (2nd to pain).       Objective:   Physical Exam  Vitals reviewed. Constitutional: She is oriented to person, place, and time. She appears well-developed and well-nourished.  HENT:  Head: Normocephalic and atraumatic.  Right Ear: External ear normal.  Left Ear: External ear normal.  Pulmonary/Chest: Effort normal.  Musculoskeletal:       Right ankle: Normal. She exhibits normal range of motion, no swelling, no ecchymosis, no deformity, no laceration and normal pulse. no tenderness. No lateral malleolus and no medial malleolus tenderness found.       Feet:  Neurological: She is alert and oriented to person, place, and time.  Skin: Skin is warm and dry.  Psychiatric: She has a normal mood and affect. Her behavior is normal. Judgment and thought content normal.   [facility] reading (PRIMARY) by  Dr. Cleta Alberts. Normal - no signs of fracture.     Assessment & Plan:   1. Ankle pain, right  DG Tibia/Fibula Right  2. Flu vaccine need  Flu vaccine greater than or equal to 3yo preservative free IM   There is  concern for a stress fracture of the distal fibula but it is not obvious on xray, which is not unexpected.  D/w pt that we will treat as a fracture and then recheck xray in 2 wks to see if we can see callus formation around the tender area..   If still unimproved pain at that time will discuss an MRI but do not think necessary today and the patient agrees.  Will treat with tall camwalker until then.  Pt can use OTC NSAIDs if she needs them for pain.  She is ok to do non-weightbearing exercise.  D/w Dr Cleta Alberts.

## 2012-10-19 ENCOUNTER — Ambulatory Visit (INDEPENDENT_AMBULATORY_CARE_PROVIDER_SITE_OTHER): Payer: BC Managed Care – PPO | Admitting: Family Medicine

## 2012-10-19 VITALS — BP 109/71 | HR 78 | Temp 98.0°F | Resp 16 | Ht 63.5 in | Wt 152.6 lb

## 2012-10-19 DIAGNOSIS — F329 Major depressive disorder, single episode, unspecified: Secondary | ICD-10-CM

## 2012-10-19 DIAGNOSIS — F419 Anxiety disorder, unspecified: Secondary | ICD-10-CM

## 2012-10-19 DIAGNOSIS — F411 Generalized anxiety disorder: Secondary | ICD-10-CM

## 2012-10-19 MED ORDER — CITALOPRAM HYDROBROMIDE 10 MG PO TABS: 30.0000 mg | ORAL_TABLET | Freq: Every day | ORAL | Status: DC

## 2012-10-19 NOTE — Progress Notes (Signed)
  Subjective:    Patient ID: Suzanne Hanson, female    DOB: [birth year], 37 y.o.   MRN: [id number] Chief Complaint  Patient presents with  . Medication Refill   HPI  Suzanne Hanson is a delightful 37 yo who is here for a refill on her citalopram 30mg .  She initially started on it about 8 yrs ago when she was struggling with infertility. 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  She has been on the 30mg  dose for almost the whole time. She has thought about weaning off of it before but having several young children doesn't make it a great time and her partner notices big improvement with her on it.  Past Medical History  Diagnosis Date  . Allergy   . Depression   . Diabetes mellitus without complication     gestational  . Anxiety    No current outpatient prescriptions on file prior to visit.   No current facility-administered medications on file prior to visit.   No Known Allergies  Review of Systems  Constitutional: Negative for fever, chills, diaphoresis, activity change, appetite change, fatigue and unexpected weight change.  Cardiovascular: Negative for chest pain.  Gastrointestinal: Negative for nausea and vomiting.  Psychiatric/Behavioral: Positive for dysphoric mood. Negative for behavioral problems, confusion, sleep disturbance, decreased concentration and agitation. The patient is not nervous/anxious and is not hyperactive.       BP 109/71  Pulse 78  Temp(Src) 98 F (36.7 C) (Oral)  Resp 16  Ht 5' 3.5" (1.613 m)  Wt 152 lb 9.6 oz (69.219 kg)  BMI 26.6 kg/m2  SpO2 97%  LMP 10/05/2012 Objective:   Physical Exam  Constitutional: She is oriented to person, place, and time. She appears well-developed and well-nourished. No distress.  HENT:  Head: Normocephalic and atraumatic.  Right Ear: External ear normal.  Left Ear: External ear normal.  Eyes: Conjunctivae are normal. No scleral icterus.  Neck: Normal range of motion.  Neck supple. No thyromegaly present.  Cardiovascular: Normal rate, regular rhythm, normal heart sounds and intact distal pulses.   Pulmonary/Chest: Effort normal and breath sounds normal. No respiratory distress.  Musculoskeletal: She exhibits no edema.  Lymphadenopathy:    She has no cervical adenopathy.  Neurological: She is alert and oriented to person, place, and time.  Skin: Skin is warm and dry. She is not diaphoretic. No erythema.  Psychiatric: She has a normal mood and affect. Her behavior is normal.          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  Meds ordered this encounter  Medications  .               . citalopram (CELEXA) 10 MG tablet    Sig: Take 3 tablets (30 mg total) by mouth daily.    Dispense:  270 tablet    Refill:  3

## 2013-04-04 ENCOUNTER — Other Ambulatory Visit: Payer: Self-pay | Admitting: Internal Medicine

## 2013-04-04 DIAGNOSIS — R42 Dizziness and giddiness: Secondary | ICD-10-CM

## 2013-04-09 ENCOUNTER — Ambulatory Visit
Admission: RE | Admit: 2013-04-09 | Discharge: 2013-04-09 | Disposition: A | Discharge status: Home / Self Care | Payer: BC Managed Care – PPO | Source: Ambulatory Visit | Attending: Internal Medicine | Admitting: Internal Medicine

## 2013-04-09 DIAGNOSIS — R42 Dizziness and giddiness: Secondary | ICD-10-CM

## 2015-10-05 ENCOUNTER — Emergency Department (HOSPITAL_COMMUNITY)
Admission: EM | Admit: 2015-10-05 | Discharge: 2015-10-05 | Disposition: A | Discharge status: Other Acute Inpatient Hospital | Payer: BC Managed Care – PPO | Attending: Emergency Medicine | Admitting: Emergency Medicine

## 2015-10-05 ENCOUNTER — Inpatient Hospital Stay (HOSPITAL_COMMUNITY)
Admission: AD | Admit: 2015-10-05 | Discharge: 2015-10-09 | DRG: 885 | Disposition: A | Discharge status: Home / Self Care | Payer: BC Managed Care – PPO | Source: Intra-hospital | Attending: Psychiatry | Admitting: Psychiatry

## 2015-10-05 ENCOUNTER — Encounter (HOSPITAL_COMMUNITY): Payer: Self-pay | Admitting: *Deleted

## 2015-10-05 ENCOUNTER — Encounter (HOSPITAL_COMMUNITY): Payer: Self-pay | Admitting: Emergency Medicine

## 2015-10-05 DIAGNOSIS — Z833 Family history of diabetes mellitus: Secondary | ICD-10-CM

## 2015-10-05 DIAGNOSIS — F329 Major depressive disorder, single episode, unspecified: Secondary | ICD-10-CM | POA: Diagnosis not present

## 2015-10-05 DIAGNOSIS — F332 Major depressive disorder, recurrent severe without psychotic features: Secondary | ICD-10-CM | POA: Diagnosis present

## 2015-10-05 DIAGNOSIS — F419 Anxiety disorder, unspecified: Secondary | ICD-10-CM | POA: Diagnosis present

## 2015-10-05 DIAGNOSIS — Z820 Family history of epilepsy and other diseases of the nervous system: Secondary | ICD-10-CM

## 2015-10-05 DIAGNOSIS — G4700 Insomnia, unspecified: Secondary | ICD-10-CM | POA: Diagnosis present

## 2015-10-05 DIAGNOSIS — Z818 Family history of other mental and behavioral disorders: Secondary | ICD-10-CM | POA: Diagnosis not present

## 2015-10-05 DIAGNOSIS — Z8262 Family history of osteoporosis: Secondary | ICD-10-CM | POA: Diagnosis not present

## 2015-10-05 DIAGNOSIS — F32A Depression, unspecified: Secondary | ICD-10-CM

## 2015-10-05 DIAGNOSIS — R45851 Suicidal ideations: Secondary | ICD-10-CM

## 2015-10-05 DIAGNOSIS — Z809 Family history of malignant neoplasm, unspecified: Secondary | ICD-10-CM | POA: Diagnosis not present

## 2015-10-05 LAB — CBC WITH DIFFERENTIAL/PLATELET
BASOS ABS: 0.1 10*3/uL (ref 0.0–0.1)
BASOS PCT: 1 %
EOS PCT: 0 %
Eosinophils Absolute: 0 10*3/uL (ref 0.0–0.7)
HEMATOCRIT: 41.5 % (ref 36.0–46.0)
Hemoglobin: 14.1 g/dL (ref 12.0–15.0)
Lymphocytes Relative: 26 %
Lymphs Abs: 2.3 10*3/uL (ref 0.7–4.0)
MCH: 31.1 pg (ref 26.0–34.0)
MCHC: 34 g/dL (ref 30.0–36.0)
MCV: 91.4 fL (ref 78.0–100.0)
MONO ABS: 0.4 10*3/uL (ref 0.1–1.0)
MONOS PCT: 5 %
NEUTROS ABS: 6.1 10*3/uL (ref 1.7–7.7)
Neutrophils Relative %: 68 %
PLATELETS: 335 10*3/uL (ref 150–400)
RBC: 4.54 MIL/uL (ref 3.87–5.11)
RDW: 12.7 % (ref 11.5–15.5)
WBC: 8.9 10*3/uL (ref 4.0–10.5)

## 2015-10-05 LAB — RAPID URINE DRUG SCREEN, HOSP PERFORMED
Amphetamines: NOT DETECTED
BENZODIAZEPINES: NOT DETECTED
Barbiturates: NOT DETECTED
COCAINE: NOT DETECTED
Opiates: NOT DETECTED
Tetrahydrocannabinol: NOT DETECTED

## 2015-10-05 LAB — PREGNANCY, URINE: Preg Test, Ur: NEGATIVE

## 2015-10-05 LAB — COMPREHENSIVE METABOLIC PANEL
ALBUMIN: 5.2 g/dL — AB (ref 3.5–5.0)
ALK PHOS: 51 U/L (ref 38–126)
ALT: 21 U/L (ref 14–54)
AST: 22 U/L (ref 15–41)
Anion gap: 5 (ref 5–15)
BILIRUBIN TOTAL: 0.7 mg/dL (ref 0.3–1.2)
BUN: 16 mg/dL (ref 6–20)
CO2: 28 mmol/L (ref 22–32)
CREATININE: 0.81 mg/dL (ref 0.44–1.00)
Calcium: 9.4 mg/dL (ref 8.9–10.3)
Chloride: 105 mmol/L (ref 101–111)
GFR calc Af Amer: >60 mL/min (ref >60)
GLUCOSE: 107 mg/dL — AB (ref 65–99)
Potassium: 3.6 mmol/L (ref 3.5–5.1)
Sodium: 138 mmol/L (ref 135–145)
TOTAL PROTEIN: 8.1 g/dL (ref 6.5–8.1)

## 2015-10-05 LAB — ETHANOL

## 2015-10-05 MED ORDER — LORATADINE 10 MG PO TABS: 10.0000 mg | ORAL_TABLET | Freq: Every day | ORAL | Status: DC

## 2015-10-05 MED ORDER — DULOXETINE HCL 60 MG PO CPEP
60.0000 mg | ORAL_CAPSULE | Freq: Every day | ORAL | Status: DC
  Administered 2015-10-05: 60 mg via ORAL
  Filled 2015-10-05: qty 1

## 2015-10-05 MED ORDER — ALUM & MAG HYDROXIDE-SIMETH 200-200-20 MG/5ML PO SUSP: 30.0000 mL | ORAL | Status: DC | PRN

## 2015-10-05 MED ORDER — ACETAMINOPHEN 325 MG PO TABS
650.0000 mg | ORAL_TABLET | Freq: Four times a day (QID) | ORAL | Status: DC | PRN
  Administered 2015-10-07: 650 mg via ORAL
  Filled 2015-10-05: qty 2

## 2015-10-05 MED ORDER — LORATADINE 10 MG PO TABS
10.0000 mg | ORAL_TABLET | Freq: Every day | ORAL | Status: DC
  Administered 2015-10-06 – 2015-10-09 (×4): 10 mg via ORAL
  Filled 2015-10-05 (×6): qty 1

## 2015-10-05 MED ORDER — ZOLPIDEM TARTRATE 5 MG PO TABS: 5.0000 mg | ORAL_TABLET | Freq: Every evening | ORAL | Status: DC | PRN

## 2015-10-05 MED ORDER — IBUPROFEN 200 MG PO TABS: 600.0000 mg | ORAL_TABLET | Freq: Three times a day (TID) | ORAL | Status: DC | PRN

## 2015-10-05 MED ORDER — DULOXETINE HCL 60 MG PO CPEP
60.0000 mg | ORAL_CAPSULE | Freq: Every day | ORAL | Status: DC
  Filled 2015-10-05 (×3): qty 1

## 2015-10-05 MED ORDER — ACETAMINOPHEN 325 MG PO TABS: 650.0000 mg | ORAL_TABLET | ORAL | Status: DC | PRN

## 2015-10-05 MED ORDER — ONDANSETRON HCL 4 MG PO TABS: 4.0000 mg | ORAL_TABLET | Freq: Three times a day (TID) | ORAL | Status: DC | PRN

## 2015-10-05 MED ORDER — LORAZEPAM 1 MG PO TABS
1.0000 mg | ORAL_TABLET | Freq: Three times a day (TID) | ORAL | Status: DC | PRN
Start: 2015-10-05 — End: 2015-10-05
  Administered 2015-10-05: 1 mg via ORAL
  Filled 2015-10-05: qty 1

## 2015-10-05 NOTE — ED Notes (Signed)
Per patient, states she and her wife were discussing ending marriage-states she started having suicidal thoughts

## 2015-10-05 NOTE — Tx Team (Signed)
Initial Interdisciplinary Treatment Plan   PATIENT STRESSORS: Marital or family conflict Occupational concerns   PATIENT STRENGTHS: Ability for insight Active sense of humor Average or above average intelligence Capable of independent living General fund of knowledge Motivation for treatment/growth   PROBLEM LIST: Problem List/Patient Goals Date to be addressed Date deferred Reason deferred Estimated date of resolution  Depression 10/05/15     Suicidal thoughts 10/05/15     "Deciding what to do about my marriage and my kids, strategies I need to take to make me happy again" 10/05/15                                          DISCHARGE CRITERIA:  Ability to meet basic life and health needs Improved stabilization in mood, thinking, and/or behavior Verbal commitment to aftercare and medication compliance  PRELIMINARY DISCHARGE PLAN: Attend aftercare/continuing care group Return to previous living arrangement  PATIENT/FAMIILY INVOLVEMENT: This treatment plan has been presented to and reviewed with the patient, Suzanne Hanson, and/or family member, .  The patient and family have been given the opportunity to ask questions and make suggestions.  [name], [name] 10/05/2015, 11:00 PM

## 2015-10-05 NOTE — ED Notes (Signed)
TTS into see 

## 2015-10-05 NOTE — ED Notes (Signed)
Report from Janie Rambo RN. Patient sleeping, respirations regular and unlabored. Q15 minute rounds and security camera observation to continue.   

## 2015-10-05 NOTE — Progress Notes (Signed)
40 year old female pt admitted on voluntary basis. Pt reports some depression and recent suicidal thoughts and relates them to having relationship issues. Pt spoke about how she has a place to go when she leaves but is unsure if she wants to go back to the same situation. Pt denies any substance abuse issues and reports that she has been taking her medications as prescribed. Pt denied any current SI on admission and able to contract for safety on the unit. Pt was oriented to the unit and safety maintained.

## 2015-10-05 NOTE — ED Notes (Signed)
Patient noted in room. No complaints, stable, in no acute distress. Q15 minute rounds and monitoring via Security Cameras to continue.  

## 2015-10-05 NOTE — ED Notes (Signed)
Patient noted sleeping in room. No complaints, stable, in no acute distress. Q15 minute rounds and monitoring via Security Cameras to continue.  

## 2015-10-05 NOTE — ED Provider Notes (Signed)
CSN: [id number]     Arrival date & time 10/05/15  1102 History   First MD Initiated Contact with Patient 10/05/15 1124     Chief Complaint  Patient presents with  . Suicidal      HPI Patient has a history of depression and anxiety who presents to emergency department with increasing depression and suicidal thoughts last night.  She is beginning to formulate a plan but did not have a specific plan in mind.  She came to the emergency department requesting assistance with severe depression and suicidal thoughts.  She is extremely tearful during the history and recently has had issues with her life partner.  Her neurologic partner are discussing ending their relationship.  She is compliant with her medications.  She drinks only occasional alcohol.   Past Medical History  Diagnosis Date  . Allergy   . Depression   . Diabetes mellitus without complication (HCC)     gestational  . Anxiety    Past Surgical History  Procedure Laterality Date  . Bunionectomy     Family History  Problem Relation Age of Onset  . Osteoporosis Mother   . Post-traumatic stress disorder Father   . Drug abuse Sister   . Epilepsy Brother   . Food Allergy Son   . Arthritis Maternal Grandmother   . Osteoporosis Maternal Grandmother   . Graves' disease Maternal Grandfather   . Cancer Maternal Grandfather   . Diabetes Paternal Grandfather   . Cancer Paternal Grandfather    Social History  Substance Use Topics  . Smoking status: Never Smoker   . Smokeless tobacco: Never Used  . Alcohol Use: No   OB History    Gravida Para Term Preterm AB TAB SAB Ectopic Multiple Living   1              Review of Systems  All other systems reviewed and are negative.     Allergies  Review of patient's allergies indicates no known allergies.  Home Medications   Prior to Admission medications   Medication Sig Start Date End Date Taking? Authorizing Provider  azelastine (ASTELIN) 0.1 % nasal spray Place 2 sprays  into both nostrils.  10/01/15  Yes Historical Provider, MD  DULoxetine (CYMBALTA) 60 MG capsule TAKE 1 CAPSULE (60 MG) BY MOUTH DAILY 08/01/15  Yes Historical Provider, MD  loratadine (CLARITIN) 10 MG tablet Take 10 mg by mouth daily.   Yes Historical Provider, MD   BP 148/81 mmHg  Pulse 85  Temp(Src) 98.3 F (36.8 C) (Oral)  Resp 16  SpO2 96% Physical Exam  Constitutional: She is oriented to person, place, and time. She appears well-developed and well-nourished.  HENT:  Head: Normocephalic.  Eyes: EOM are normal.  Neck: Normal range of motion.  Pulmonary/Chest: Effort normal.  Abdominal: She exhibits no distension.  Musculoskeletal: Normal range of motion.  Neurological: She is alert and oriented to person, place, and time.  Psychiatric:  Tearful.  Flat affect.  Suicidal ideation  Nursing note and vitals reviewed.   ED Course  Procedures (including critical care time) Labs Review Labs Reviewed - No data to display  Imaging Review No results found. I have personally reviewed and evaluated these images and lab results as part of my medical decision-making.   EKG Interpretation None      MDM   Final diagnoses:  None    Medically clear at this time.  TTS to evaluate.  Patient with severe depression and suicidal ideation.  She'll benefit  from acute hospitalization for stabilization.     Azalia Bilis, MD 10/05/15 [phone]

## 2015-10-05 NOTE — ED Notes (Signed)
Up to the bathroom 

## 2015-10-05 NOTE — BH Assessment (Addendum)
Assessment Note  Suzanne Hanson is an 40 y.o. female that presents this date with thoughts of self harm with thoughts of overdosing on medications. Patient stated she is currently married and her wife Suzanne Hanson(Suzanne Hanson [phone]) have been having some relationship issues that has put additional stress on the patient (along with stress in reference to her job) that has escalated to the point of patient having thoughts of harming herself. Patient stated the feelings have intensified over the past few days and increased her anxiety and depression. Patient is currently being seen at [facility] where she has been seeing a therapist Suzanne Hanson([facility]) for the last two years. Patient states she also is currently taking medication (Cynbalta) for depression which is prescribed by her GP Suzanne Hanson(Suzanne Hanson). Patient reports being current with her medication regimen and has been taking the medication for almost two years. Patient denies any prior inpatient/outpatient admission and is open to a voluntary admission this date. Patient states she is afraid to go back to her residence and cannot contact for safety. Patient denies any previous S/I or attempts and states she is "really scared." Patient was very tearful on admission and rated her depression to be at a 10 and anxiety an 8. Patient is visibly shaking and speaks in a low voice although makes good eye contact with this Clinical research associatewriter. Patient denies any SA use but reports she is a social drinker. Patient states she has been diagnosed with Depression and GAD by her provider. Patient states she feels her medication is no longer working reporting episodes of insomnia, loss of appetite, increased depression and anxiety.Patient also stated that she suffers from Seasonal Affective D/O. Patient is requesting a voluntary inpatient admission. Case was staffed with Suzanne Hanson who recommended an inpatient admission as appropriate bed placement is investigated.     Diagnosis:  Major Depressive D/O recurrent severe, GAD  Past Medical History:  Past Medical History  Diagnosis Date  . Allergy   . Depression   . Diabetes mellitus without complication (HCC)     gestational  . Anxiety     Past Surgical History  Procedure Laterality Date  . Bunionectomy      Family History:  Family History  Problem Relation Age of Onset  . Osteoporosis Mother   . Post-traumatic stress disorder Father   . Drug abuse Sister   . Epilepsy Brother   . Food Allergy Son   . Arthritis Maternal Grandmother   . Osteoporosis Maternal Grandmother   . Graves' disease Maternal Grandfather   . Cancer Maternal Grandfather   . Diabetes Paternal Grandfather   . Cancer Paternal Grandfather     Social History:  reports that she has never smoked. She has never used smokeless tobacco. She reports that she does not drink alcohol or use illicit drugs.  Additional Social History:  Alcohol / Drug Use Pain Medications: See MAR Prescriptions: See MAR Over the Counter: See MAR History of alcohol / drug use?: No history of alcohol / drug abuse  CIWA: CIWA-Ar BP: 148/81 mmHg Pulse Rate: 85 COWS:    Allergies: No Known Allergies  Home Medications:  (Not in a hospital admission)  OB/GYN Status:  No LMP recorded.  General Assessment Data Location of Assessment: [facility] TTS Assessment: In system Is this a Tele or Face-to-Face Assessment?: Face-to-Face Is this an Initial Assessment or a Re-assessment for this encounter?: Initial Assessment Marital status: Married ParkerMaiden name: na Is patient pregnant?: No Pregnancy Status: No Living Arrangements: Spouse/significant other Can  pt return to current living arrangement?: Yes Admission Status: Voluntary Is patient capable of signing voluntary admission?: Yes Referral Source: Self/Family/Friend Insurance type: Tax adviser Exam [facility] Walk-[facility] ONLY) Medical Exam completed: Yes  Crisis Care Plan Living Arrangements:  Spouse/significant other Legal Guardian: Other: (None) Name of Psychiatrist: None Name of Therapist: Eudelia Bunch [facility]  Education Status Is patient currently in school?: No Current Grade: na Highest grade of school patient has completed: 12 Name of school: 72 Contact person: na  Risk to self with the past 6 months Suicidal Ideation: Yes-Currently Present Has patient been a risk to self within the past 6 months prior to admission? : No Suicidal Intent: No Has patient had any suicidal intent within the past 6 months prior to admission? : No Is patient at risk for suicide?: Yes Suicidal Plan?: Yes-Currently Present Has patient had any suicidal plan within the past 6 months prior to admission? : No Specify Current Suicidal Plan: Overdose Access to Means: No What has been your use of drugs/alcohol within the last 12 months?: Currently denies Previous Attempts/Gestures: No How many times?: 0 Other Self Harm Risks: none Triggers for Past Attempts: Other (Comment) (relationship issues) Intentional Self Injurious Behavior: None Family Suicide History: No Recent stressful life event(s): Other (Comment) (Relationship issues, employment) Persecutory voices/beliefs?: No Depression: Yes Depression Symptoms: Feeling worthless/self pity, Feeling angry/irritable Substance abuse history and/or treatment for substance abuse?: No Suicide prevention information given to non-admitted patients: Not applicable  Risk to Others within the past 6 months Homicidal Ideation: No Does patient have any lifetime risk of violence toward others beyond the six months prior to admission? : No Thoughts of Harm to Others: No Current Homicidal Intent: No Current Homicidal Plan: No Access to Homicidal Means: No Identified Victim: na History of harm to others?: No Assessment of Violence: None Noted Violent Behavior Description: None Does patient have access to weapons?: No Criminal Charges Pending?: No Does  patient have a court date: No Is patient on probation?: No  Psychosis Hallucinations: None noted Delusions: None noted  Mental Status Report Appearance/Hygiene: Unremarkable Eye Contact: Good Motor Activity: Freedom of movement Speech: Logical/coherent Level of Consciousness: Alert Mood: Depressed Affect: Depressed Anxiety Level: Moderate Thought Processes: Coherent, Relevant Judgement: Unimpaired Orientation: Person, Place, Time Obsessive Compulsive Thoughts/Behaviors: None  Cognitive Functioning Concentration: Normal Memory: Recent Intact, Remote Intact IQ: Above Average Insight: Good Impulse Control: Fair Appetite: Fair Weight Loss: 0 Weight Gain: 0 Sleep: Decreased Total Hours of Sleep: 5 Vegetative Symptoms: None  ADLScreening [facility] Assessment Services) Patient's cognitive ability adequate to safely complete daily activities?: Yes Patient able to express need for assistance with ADLs?: Yes Independently performs ADLs?: Yes (appropriate for developmental age)  Prior Inpatient Therapy Prior Inpatient Therapy: No Prior Therapy Dates: na Prior Therapy Facilty/Provider(s): na Reason for Treatment: na  Prior Outpatient Therapy Prior Outpatient Therapy: Yes Prior Therapy Dates: 2017 Prior Therapy Facilty/Provider(s): [facility] Reason for Treatment: Depression Anxiety Does patient have an ACCT team?: No Does patient have Intensive In-House Services?  : No Does patient have [facility] services? : No Does patient have P4CC services?: No  ADL Screening (condition at time of admission) Patient's cognitive ability adequate to safely complete daily activities?: Yes Is the patient deaf or have difficulty hearing?: No Does the patient have difficulty seeing, even when wearing glasses/contacts?: No Does the patient have difficulty concentrating, remembering, or making decisions?: No Patient able to express need for assistance with ADLs?: Yes Does the patient have  difficulty dressing or  bathing?: No Independently performs ADLs?: Yes (appropriate for developmental age) Does the patient have difficulty walking or climbing stairs?: No Weakness of Legs: None Weakness of Arms/Hands: None  Home Assistive Devices/Equipment Home Assistive Devices/Equipment: None  Therapy Consults (therapy consults require a physician order) PT Evaluation Needed: No OT Evalulation Needed: No SLP Evaluation Needed: No Abuse/Neglect Assessment (Assessment to be complete while patient is alone) Physical Abuse: Denies Verbal Abuse: Denies Sexual Abuse: Denies Exploitation of patient/patient's resources: Denies Self-Neglect: Denies Values / Beliefs Cultural Requests During Hospitalization: None Spiritual Requests During Hospitalization: None Consults Spiritual Care Consult Needed: No Social Work Consult Needed: No Merchant navy officer (For Healthcare) Does patient have an advance directive?: No Would patient like information on creating an advanced directive?: No - patient declined information (pt declined information)    Additional Information 1:1 In Past 12 Months?: No CIRT Risk: No Elopement Risk: No Does patient have medical clearance?: Yes     Disposition: Case was staffed with Suzanne Pollack Hanson who recommended an inpatient admission as appropriate bed placement is investigated.      Disposition Initial Assessment Completed for this Encounter: Yes Disposition of Patient: Inpatient treatment program Type of inpatient treatment program: Adult  On Site Evaluation by:   Reviewed with Physician:    Alfredia Ferguson 10/05/2015 2:02 PM

## 2015-10-06 ENCOUNTER — Encounter (HOSPITAL_COMMUNITY): Payer: Self-pay | Admitting: Psychiatry

## 2015-10-06 DIAGNOSIS — F332 Major depressive disorder, recurrent severe without psychotic features: Principal | ICD-10-CM

## 2015-10-06 MED ORDER — LORAZEPAM 0.5 MG PO TABS: 0.5000 mg | ORAL_TABLET | Freq: Four times a day (QID) | ORAL | Status: DC | PRN

## 2015-10-06 MED ORDER — DULOXETINE HCL 60 MG PO CPEP
60.0000 mg | ORAL_CAPSULE | Freq: Two times a day (BID) | ORAL | Status: DC
  Administered 2015-10-06 – 2015-10-09 (×6): 60 mg via ORAL
  Filled 2015-10-06 (×10): qty 1

## 2015-10-06 MED ORDER — MIRTAZAPINE 15 MG PO TABS
15.0000 mg | ORAL_TABLET | Freq: Every day | ORAL | Status: DC
  Administered 2015-10-06 – 2015-10-08 (×3): 15 mg via ORAL
  Filled 2015-10-06 (×5): qty 1

## 2015-10-06 NOTE — Progress Notes (Signed)
Adult Psychoeducational Group Note  Date:  10/06/2015 Time:  8:15 PM  Group Topic/Focus:  Wrap-Up Group:   The focus of this group is to help patients review their daily goal of treatment and discuss progress on daily workbooks.  Participation Level:  Active  Participation Quality:  Appropriate  Affect:  Appropriate  Cognitive:  Appropriate  Insight: Appropriate  Engagement in Group:  Engaged  Modes of Intervention:  Discussion  Additional Comments:  Pt rated her overall day a 6 out of 10 because she is beginning to feel better. Pt reported that she did not have a goal for the day.   Cleotilde NeerJasmine S Jakia Kennebrew 10/06/2015, 8:39 PM

## 2015-10-06 NOTE — Progress Notes (Signed)
D:  Patient's self inventory sheet, patient has fair sleep, no sleep medication given.  Fair appetite, low energy level, good concentration.  Rated depression 7, hopeless and anxiety 5.  Denied withdrawals.  Denied SI.  Denied physical problems.  Denied pain.  Goal is "deciding how to proceed with working on marriage.  Plan to talk to counselor and to spouse." A:  Medications administered per MD order.  Emotional support and encouragement given patient. R:  Denied SI and HI while talking to nurse this morning.  Denied A/V hallucinations.  Safety maintained with 15 minute checks.

## 2015-10-06 NOTE — [facility] Group Notes (Signed)
[facility] LCSW Group Therapy  10/06/2015 10:00 PM  Type of Therapy:  Group Therapy  Participation Level:  None  Participation Quality:  Attentive  Affect:  Flat  Cognitive:  Alert  Insight:  None  Engagement in Therapy:  None  Modes of Intervention:  Discussion  Summary of Progress/Problems: Group topic was about self-identity. Initially Group discussed what others say about us individually. Then discussed who we know ourselves to be. From there group moved on to identify the intersection between external messages and internal messages/identity. Group participants were able to engage in discussion about these identities and even included the good and bad things we say about ourselves. Facilitator identified 3 points to take away: 1) Identify your baseline; 2) Allow your actions to reflect what you know your identity to be; and 3) Use compartmentalization to gain perspective on various mood states. Patient was present but did not participate in discussion with group.  Beverly SessionsLINDSEY, Gamaliel Charney J 10/06/2015, 12:01 PM

## 2015-10-06 NOTE — Progress Notes (Signed)
PATIENT SIGNED 72 HR REQUEST FOR DISCHARGE ON 10/06/2015 AT 1835.

## 2015-10-06 NOTE — [facility] Counselor (Signed)
Adult Comprehensive Assessment  Patient ID: Suzanne Hanson, female   DOB: March 18, 1976, [age] Y.[address] [place]   MRN: [id number]  Information Source: Information source: Patient  Current Stressors:  Educational / Learning stressors: NA Employment / Job issues: Gainfully employed yet would like to find more meaningful work Family Relationships: Some strain as couple Acupuncturistnavigates change Financial / Lack of resources (include bankruptcy): NA Housing / Lack of housing: NA Physical health (include injuries & life threatening diseases): Diabetes and depression Social relationships: NA Substance abuse: NA Bereavement / Loss: NA  Living/Environment/Situation:  Living Arrangements: Spouse/significant other Living conditions (as described by patient or guardian): Single family home in stable neighborhood How long has patient lived in current situation?: 9 years in current home What is atmosphere in current home: Comfortable, ParamedicLoving, Supportive, Other (Comment) (some chaos due to 3 children in the home)  Family History:  Marital status: Married Number of Years Married: 16 What types of issues is patient dealing with in the relationship?: 11 YO adopted child has attachment issues; parenting of 40 YO twins, same sex partner; [place] does not Additional relationship information: Some disconnect during parenting years Are you sexually active?: Yes What is your sexual orientation?: Same sex relationship Has your sexual activity been affected by drugs, alcohol, medication, or emotional stress?: at times Does patient have children?: Yes How many children?: 3 How is patient's relationship with their children?: most challenging w 40 yo adopted daughter; some stress with demands of parenting her along with two 40 YO twins; relationships are good overall  Childhood History:  By whom was/is the patient raised?: Both parents Additional childhood history information:  NA Description of patient's relationship with caregiver when they were a child: Good w both Patient's description of current relationship with people who raised him/her: Good w both Does patient have siblings?: Yes Number of Siblings: 2 Description of patient's current relationship with siblings: Overall good w both Did patient suffer any verbal/emotional/physical/sexual abuse as a child?: No Did patient suffer from severe childhood neglect?: No Has patient ever been sexually abused/assaulted/raped as an adolescent or adult?: No Was the patient ever a victim of a crime or a disaster?: No Witnessed domestic violence?: No Has patient been effected by domestic violence as an adult?: No  Education:  Highest grade of school patient has completed: 18 Currently a student?: No Learning disability?: No  Employment/Work Situation:   Employment situation: Employed 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 How long has patient been employed?: 2 years Patient's job has been impacted by current illness: No Has patient ever been in the Eli Lilly and Companymilitary?: No Are There Guns or Other Weapons in Your Home?: No  Financial Resources:   Financial resources: Income from employment, Income from spouse  Alcohol/Substance Abuse:   What has been your use of drugs/alcohol within the last 12 months?: Occasional Drinker Alcohol/Substance Abuse Treatment Hx: Denies past history Has alcohol/substance abuse ever caused legal problems?: No  Social Support System:   Conservation officer, natureatient's Community Support System: Good Describe Community Support System: New friends and long time friends Type of faith/religion: Pam DrownQuaker How does patient's faith help to cope with current illness?: Helpful  Leisure/Recreation:   Leisure and Hobbies: Gym  Strengths/Needs:   What things does the patient do well?: Good employee; good and faithful spouse; medication compliant In what areas does patient struggle / problems for  patient: Depression, disconnect w spouse  Discharge Plan:   Does patient have access to transportation?: Yes  Will patient be returning to same living situation after discharge?: Yes Currently receiving community mental health services: Yes (From Whom) (Sees therapist Eudelia Bunch at [facility]) If no, would patient like referral for services when discharged?: Yes (What county?) (Pt would like to see PA at [facility] for med mgt) Does patient have financial barriers related to discharge medications?: No  Summary/Recommendations:   Summary and Recommendations (to be completed by the evaluator): Patient is 40 YO married employed female admitted to [facility] with Depression and Anxiety  who reports primary trigger for admission are increased marital and professional stressors.  Patient will benefit from crisis stabilization, medication evaluation, group therapy and psycho education, in addition to case management for discharge planning. At discharge it is recommended that patient adhere to the established discharge plan and continue in treatment.   Clide Dales. 10/06/2015

## 2015-10-06 NOTE — H&P (Signed)
Psychiatric Admission Assessment Adult  Patient Identification: Suzanne Hanson MRN:  [id number] Date of Evaluation:  10/06/2015 Chief Complaint:  " I have been more depressed " Principal Diagnosis: Major Depression  Diagnosis:   Patient Active Problem List   Diagnosis Date Noted  . MDD (major depressive disorder), recurrent severe, without psychosis ([place]) [F33.2] 10/05/2015   History of Present Illness: 40 year old married  Female, employed. Reports  she has been feeling increasingly  depressed for several weeks . States that she has been having some marital difficulties, and had recently not been offered a job that she really wanted. Reports she has been  feeling sad, depressed , and recently developed some suicidal ideations , with some thoughts of crashing her bicycle on purpose, particularly  after her wife told her that her depression and irritability was negatively affecting their relationship. States " I decided I needed to come in to the hospital"  Associated Signs/Symptoms: Depression Symptoms:  depressed mood, anhedonia, insomnia, suicidal thoughts with specific plan, anxiety, loss of energy/fatigue, decreased appetite, weight stable  low sense of self esteem (Hypo) Manic Symptoms:  Denies  Anxiety Symptoms: denies - describes worrying excessively  Psychotic Symptoms:  Denies  PTSD Symptoms: Denies  Total Time spent with patient: 45 minutes  Past Psychiatric History: no prior psychiatric admissions, denies history of suicide attempts, denies history of self cutting, no history of mania, no history of PTSD, no history of psychosis. Reports history of worsening depression during winter months, has been told she has Seasonal Affective Disorder  No panic or agoraphobia .   Is the patient at risk to self? Yes.    Has the patient been a risk to self in the past 6 months? Yes.    Has the patient been a risk to self within the distant past? No.  Is the patient a risk to  others? No.  Has the patient been a risk to others in the past 6 months? No.  Has the patient been a risk to others within the distant past? No.   Prior Inpatient Therapy:  none  Prior Outpatient Therapy:   has been prescribed psychiatric medications by her PCP, recently started individual therapy .  Alcohol Screening: 1. How often do you have a drink containing alcohol?: Never 9. Have you or someone else been injured as a result of your drinking?: No 10. Has a relative or friend or a doctor or another health worker been concerned about your drinking or suggested you cut down?: No Alcohol Use Disorder Identification Test Final Score (AUDIT): 0 Brief Intervention: AUDIT score less than 7 or less-screening does not suggest unhealthy drinking-brief intervention not indicated Substance Abuse History in the last 12 months:  Denies any alcohol or drug abuse  Consequences of Substance Abuse: Denies  Previous Psychotropic Medications:  Cymbalta x 1(+) years. States she has been taking it as prescribed .  States she was on Celexa in the past, but had stopped it due to side effects. Psychological Evaluations:  No  Past Medical History: denies medical issues  Past Medical History  Diagnosis Date  . Allergy   . Depression   . Diabetes mellitus without complication ([place])     gestational  . Anxiety     Past Surgical History  Procedure Laterality Date  . Bunionectomy     Family History: parents alive, has one brother and one sister . Family History  Problem Relation Age of Onset  . Osteoporosis Mother   . Post-traumatic stress disorder  Father   . Drug abuse Sister   . Epilepsy Brother   . Food Allergy Son   . Arthritis Maternal Grandmother   . Osteoporosis Maternal Grandmother   . Graves' disease Maternal Grandfather   . Cancer Maternal Grandfather   . Diabetes Paternal Grandfather   . Cancer Paternal Grandfather    Family Psychiatric  History:  Sister has history of depression and  substance abuse, father has history of PTSD related to serving in [place], no suicides in family  Tobacco Screening: does not smoke Social History:  Married x 16 years,  Employed, has three children, 15, 26 year old twins employed.   Reports marital stressors.   History  Alcohol Use No     History  Drug Use No    Additional Social History:  Allergies:  No Known Allergies Lab Results:  Results for orders placed or performed during the hospital encounter of 10/05/15 (from the past 48 hour(s))  Urine rapid drug screen (hosp performed)not at [facility]     Status: None   Collection Time: 10/05/15  1:23 PM  Result Value Ref Range   Opiates NONE DETECTED NONE DETECTED   Cocaine NONE DETECTED NONE DETECTED   Benzodiazepines NONE DETECTED NONE DETECTED   Amphetamines NONE DETECTED NONE DETECTED   Tetrahydrocannabinol NONE DETECTED NONE DETECTED   Barbiturates NONE DETECTED NONE DETECTED    Comment:        DRUG SCREEN FOR MEDICAL PURPOSES ONLY.  IF CONFIRMATION IS NEEDED FOR ANY PURPOSE, NOTIFY LAB WITHIN 5 DAYS.        LOWEST DETECTABLE LIMITS FOR URINE DRUG SCREEN Drug Class       Cutoff (ng/mL) Amphetamine      1000 Barbiturate      200 Benzodiazepine   093 Tricyclics       235 Opiates          300 Cocaine          300 THC              50   Pregnancy, urine     Status: None   Collection Time: 10/05/15  1:23 PM  Result Value Ref Range   Preg Test, Ur NEGATIVE NEGATIVE    Comment:        THE SENSITIVITY OF THIS METHODOLOGY IS >20 mIU/mL.   Comprehensive metabolic panel     Status: Abnormal   Collection Time: 10/05/15  1:31 PM  Result Value Ref Range   Sodium 138 135 - 145 mmol/L   Potassium 3.6 3.5 - 5.1 mmol/L   Chloride 105 101 - 111 mmol/L   CO2 28 22 - 32 mmol/L   Glucose, Bld 107 (H) 65 - 99 mg/dL   BUN 16 6 - 20 mg/dL   Creatinine, Ser 0.81 0.44 - 1.00 mg/dL   Calcium 9.4 8.9 - 10.3 mg/dL   Total Protein 8.1 6.5 - 8.1 g/dL   Albumin 5.2 (H) 3.5 - 5.0 g/dL   AST 22  15 - 41 U/L   ALT 21 14 - 54 U/L   Alkaline Phosphatase 51 38 - 126 U/L   Total Bilirubin 0.7 0.3 - 1.2 mg/dL   GFR calc non Af Amer >60 >60 mL/min   GFR calc Af Amer >60 >60 mL/min    Comment: (NOTE) The eGFR has been calculated using the CKD EPI equation. This calculation has not been validated in all clinical situations. eGFR's persistently <60 mL/min signify possible Chronic Kidney Disease.    Anion  gap 5 5 - 15  CBC with Diff     Status: None   Collection Time: 10/05/15  1:31 PM  Result Value Ref Range   WBC 8.9 4.0 - 10.5 K/uL   RBC 4.54 3.87 - 5.11 MIL/uL   Hemoglobin 14.1 12.0 - 15.0 g/dL   HCT 41.5 36.0 - 46.0 %   MCV 91.4 78.0 - 100.0 fL   MCH 31.1 26.0 - 34.0 pg   MCHC 34.0 30.0 - 36.0 g/dL   RDW 12.7 11.5 - 15.5 %   Platelets 335 150 - 400 K/uL   Neutrophils Relative % 68 %   Neutro Abs 6.1 1.7 - 7.7 K/uL   Lymphocytes Relative 26 %   Lymphs Abs 2.3 0.7 - 4.0 K/uL   Monocytes Relative 5 %   Monocytes Absolute 0.4 0.1 - 1.0 K/uL   Eosinophils Relative 0 %   Eosinophils Absolute 0.0 0.0 - 0.7 K/uL   Basophils Relative 1 %   Basophils Absolute 0.1 0.0 - 0.1 K/uL  Ethanol     Status: None   Collection Time: 10/05/15  1:32 PM  Result Value Ref Range   Alcohol, Ethyl (B) <5 <5 mg/dL    Comment:        LOWEST DETECTABLE LIMIT FOR SERUM ALCOHOL IS 5 mg/dL FOR MEDICAL PURPOSES ONLY     Blood Alcohol level:  Lab Results  Component Value Date   ETH <5 82/95/6213    Metabolic Disorder Labs:  No results found for: HGBA1C, MPG No results found for: PROLACTIN No results found for: CHOL, TRIG, HDL, CHOLHDL, VLDL, LDLCALC  Current Medications: Current Facility-Administered Medications  Medication Dose Route Frequency Provider Last Rate Last Dose  . acetaminophen (TYLENOL) tablet 650 mg  650 mg Oral Q6H PRN Lurena Nida, NP      . DULoxetine (CYMBALTA) DR capsule 60 mg  60 mg Oral Daily Lurena Nida, NP   60 mg at 10/06/15 0739  . loratadine (CLARITIN) tablet  10 mg  10 mg Oral Daily Lurena Nida, NP   10 mg at 10/06/15 0865   PTA Medications: Prescriptions prior to admission  Medication Sig Dispense Refill Last Dose  . azelastine (ASTELIN) 0.1 % nasal spray Place 2 sprays into both nostrils.    10/04/2015 at Unknown time  . DULoxetine (CYMBALTA) 60 MG capsule TAKE 1 CAPSULE (60 MG) BY MOUTH DAILY  0 10/04/2015 at Unknown time  . loratadine (CLARITIN) 10 MG tablet Take 10 mg by mouth daily.   10/05/2015 at Unknown time    Musculoskeletal: Strength & Muscle Tone: within normal limits Gait & Station: normal Patient leans: N/A  Psychiatric Specialty Exam: Physical Exam  Review of Systems  Constitutional: Negative.   HENT: Negative.   Eyes: Negative.   Respiratory: Negative.   Cardiovascular: Negative.   Gastrointestinal: Negative.   Genitourinary: Negative.   Musculoskeletal: Negative.   Skin: Negative.   Neurological: Negative for seizures.  Endo/Heme/Allergies: Negative.   Psychiatric/Behavioral: Positive for depression and suicidal ideas.  all other symptoms negative   Blood pressure 104/73, pulse 96, temperature 98.4 F (36.9 C), temperature source Oral, resp. rate 16, height 5' 2.5" (1.588 m), weight 139 lb (63.05 kg).Body mass index is 25 kg/(m^2).  General Appearance: Fairly Groomed  Eye Contact:  Good  Speech:  Normal Rate  Volume:  Normal  Mood:  Depressed  Affect:  Constricted and Tearful  Thought Process:  Linear  Orientation:  Full (Time, Place, and Person)  Thought  Content:  denies hallucinations, no delusions   Suicidal Thoughts:  No- denies any suicidal ideations at this time,and contracts for safety on the unit   Homicidal Thoughts:  No denies any violent or homicidal ideations   Memory:  recent and remote grossly intact   Judgement:  Fair  Insight:  Fair  Psychomotor Activity:  Normal  Concentration:  Concentration: Good and Attention Span: Good  Recall:  Good  Fund of Knowledge:  Good  Language:  Good   Akathisia:  Negative  Handed:  Right  AIMS (if indicated):     Assets:  Desire for Improvement Resilience  ADL's:  Intact  Cognition:  WNL  Sleep:  Number of Hours: 5.75    Treatment Plan Summary: Daily contact with patient to assess and evaluate symptoms and progress in treatment, Medication management, Plan inpatient admission  and medications as below  Observation Level/Precautions:  15 minute checks  Laboratory:  as needed - check TSH   Psychotherapy:  Milieu, support   Medications:  Increase Cymbalta to 60 mgrs BID, add Remeron 15 mgrs QHS as augmentation and for sleep- side effects and rationale reviewed   Consultations:  As needed   Discharge Concerns:  -  Estimated LOS: 5 days   Other:     I certify that inpatient services furnished can reasonably be expected to improve the patient's condition.    Neita Garnet, MD 5/28/201712:53 PM

## 2015-10-06 NOTE — [facility] Suicide Risk Assessment (Signed)
[facility]   Nursing information obtained from:   patient and chart  Demographic factors:   40 year old married female, employed  Current Mental Status:   see below  Loss Factors:   marital difficulties  Historical Factors:   depression Risk Reduction Factors:   resilience, employment, sense of responsibility to family   Total Time spent with patient: 45 minutes Principal Problem:  MDD  Diagnosis:   Patient Active Problem List   Diagnosis Date Noted  . MDD (major depressive disorder), recurrent severe, without psychosis ([facility]) [F33.2] 10/05/2015     Continued Clinical Symptoms:  Alcohol Use Disorder Identification Test Final Score (AUDIT): 0 The "Alcohol Use Disorders Identification Test", Guidelines for Use in Primary Care, Second Edition.  World Science writerHealth Organization [facility](WHO). Score between 0-7:  no or low risk or alcohol related problems. Score between 8-15:  moderate risk of alcohol related problems. Score between 16-19:  high risk of alcohol related problems. Score 20 or above:  warrants further diagnostic evaluation for alcohol dependence and treatment.   CLINICAL FACTORS:   40 year old married female, presents to ED voluntarily due to worsening depression and emergence of suicidal ideations     Psychiatric Specialty Exam: Physical Exam  ROS  Blood pressure 104/73, pulse 96, temperature 98.4 F (36.9 C), temperature source Oral, resp. rate 16, height 5' 2.5" (1.588 m), weight 139 lb (63.05 kg).Body mass index is 25 kg/(m^2).   see admit note MSE                                                         COGNITIVE FEATURES THAT CONTRIBUTE TO RISK:  Closed-mindedness and Loss of executive function    SUICIDE RISK:   Moderate:  Frequent suicidal ideation with limited intensity, and duration, some specificity in terms of plans, no associated intent, good self-control, limited dysphoria/symptomatology, some risk factors  present, and identifiable protective factors, including available and accessible social support.  PLAN OF CARE: Patient will be admitted to inpatient psychiatric unit for stabilization and safety. Will provide and encourage milieu participation. Provide medication management and maked adjustments as needed.  Will follow daily.    I certify that inpatient services furnished can reasonably be expected to improve the patient's condition.   Nehemiah MassedOBOS, FERNANDO, MD 10/06/2015, 1:20 PM

## 2015-10-06 NOTE — Progress Notes (Signed)
D:Patient in dayroom on approach.  Patient states this is her second day.  Patient states she feels things are going ok.  Patient states her main stressors are work and her relationship.  Patient states she and her wife are going to counciling when discharge.  Patient states she usually works out to relieve stress.  Patient states she is going to read books her wife brought her in her free time.  Patient denies SI/HI and denies AVH. A: Staff to monitor Q 15 mins for safety.  Encouragement and support offered.  Scheduled medications administered per orders.   R: Patient remains safe on the unit.  Patient attended group tonight.  Patient visible on the unit.  Patient taking administered medications.

## 2015-10-06 NOTE — Plan of Care (Signed)
Problem: Education: Goal: Utilization of techniques to improve thought processes will improve Outcome: Progressing Nurse discussed depression/coping skills with patient.    

## 2015-10-07 LAB — TSH: TSH: 1.611 u[IU]/mL (ref 0.350–4.500)

## 2015-10-07 NOTE — Progress Notes (Signed)
Recreation Therapy Notes  Date: 05.29.2017 Time: 9:30am Location: 300 Hall Group Room   Group Topic: Stress Management  Goal Area(s) Addresses:  Patient will actively participate in stress management techniques presented during session.   Behavioral Response: Engaged, Attentive, Appropriate   Intervention: Stress management techniques  Activity :  Deep Breathing and Progressive Body Relaxation. LRT provided education, instruction and demonstration on practice of Deep Breathing and Progressive Body Relaxation. Patient was asked to participate in technique introduced during session.   Education:  Stress Management, Discharge Planning.   Education Outcome: Acknowledges education  Clinical Observations/Feedback: Patient actively engaged in technique introduced, expressed no concerns and demonstrated ability to practice independently post d/c.   Suzanne Hanson, LRT/CTRS        Tanvi Gatling L 10/07/2015 2:40 PM

## 2015-10-07 NOTE — Progress Notes (Signed)
Patient will discuss pain med for R ft with MD, was given tylenol this a.m. Patient takes naproxen at home.

## 2015-10-07 NOTE — Progress Notes (Signed)
Adult Psychoeducational Group Note  Date:  10/07/2015 Time:  8:20 PM  Group Topic/Focus:  Wrap-Up Group:   The focus of this group is to help patients review their daily goal of treatment and discuss progress on daily workbooks.  Participation Level:  Active  Participation Quality:  Appropriate  Affect:  Appropriate  Cognitive:  Appropriate  Insight: Appropriate  Engagement in Group:  Engaged  Modes of Intervention:  Discussion  Additional Comments:  Pt rated her overall day a 7 out of 10 and stated that she had a "pretty good" day. Pt reported that she achieved her goal for the day, which was to talk to the doctor and social worker about a situation regarding her marriage. Pt reported that she learned a new strategy as a result.   Cleotilde NeerJasmine S Manvir Prabhu 10/07/2015, 8:32 PM

## 2015-10-07 NOTE — Tx Team (Signed)
Interdisciplinary Treatment Plan Update (Adult) Date: 10/07/2015   Date: 10/07/2015 8:41 AM  Progress in Treatment:  Attending groups: Yes  Participating in groups: No Taking medication as prescribed: Yes  Tolerating medication: Yes  Family/Significant othe contact made: No, CSW attempting to make contact with wife Patient understands diagnosis: Continuing to assess Discussing patient identified problems/goals with staff: Yes  Medical problems stabilized or resolved: Yes  Denies suicidal/homicidal ideation: Yes Patient has not harmed self or Others: Yes   New problem(s) identified: None identified at this time.   Discharge Plan or Barriers: Pt will return home and follow-up with [facility]  Additional comments:  Patient and CSW reviewed pt's identified goals and treatment plan. Patient verbalized understanding and agreed to treatment plan. CSW reviewed [facility] "Discharge Process and Patient Involvement" Form. Pt verbalized understanding of information provided and signed form.   Reason for Continuation of Hospitalization:  Anxiety Depression Medication stabilization Suicidal ideation  Estimated length of stay: 2-3 days  Review of initial/current patient goals per problem list:   1.  Goal(s): Patient will participate in aftercare plan  Met:  Yes  Target date: 3-5 days from date of admission   As evidenced by: Patient will participate within aftercare plan AEB aftercare provider and housing plan at discharge being identified.   10/07/15: Pt will return home and follow-up with [facility]  2.  Goal (s): Patient will exhibit decreased depressive symptoms and suicidal ideations.  Met:  Progressing  Target date: 3-5 days from date of admission   As evidenced by: Patient will utilize self rating of depression at 3 or below and demonstrate decreased signs of depression or be deemed stable for discharge by MD.  10/07/15: Pt  reports improvement in mood and denies SI at this time. Pt rates depression at 5/10  3.  Goal(s): Patient will demonstrate decreased signs and symptoms of anxiety.  Met:  Progressing  Target date: 3-5 days from date of admission   As evidenced by: Patient will utilize self rating of anxiety at 3 or below and demonstrated decreased signs of anxiety, or be deemed stable for discharge by MD  10/07/15: Pt rates anxiety at 5/10  Attendees:  Patient:    Family:    Physician: Dr. Cobos, MD  10/07/2015 8:41 AM  Nursing: Jennifer Clark, RN Case manager  10/07/2015 8:41 AM  Clinical Social Worker  Carter, LCSWA 10/07/2015 8:41 AM  Other: Kristin Drinkard, LCSWA 10/07/2015 8:41 AM  Clinical: Patrice White RN; Beverly Knight, RN 10/07/2015 8:41 AM  Other: , RN Charge Nurse 10/07/2015 8:41 AM  Other: Dolora Sutton, P4CC     Carter, LCSWA Clinical Social Work [phone]      

## 2015-10-07 NOTE — Plan of Care (Signed)
Problem: Education: Goal: Utilization of techniques to improve thought processes will improve Outcome: Progressing Nurse discussed depression/coping skills with patient.    

## 2015-10-07 NOTE — [facility] Group Notes (Signed)
[facility]  10/07/2015 8:45 AM  Participation Quality: Alert, Appropriate and Oriented  Mood/Affect: Flat  Depression Rating: 5  Anxiety Rating: 5  Thoughts of Suicide: Pt denies SI/HI  Will you contract for safety? Yes  Current AVH: Pt denies  Plan for Discharge/Comments: Pt attended discharge planning group and actively participated in group. CSW discussed suicide prevention education with the group and encouraged them to discuss discharge planning and any relevant barriers. Pt reports improvement in mood and is hoping to have her medication management services changed to her outpatient provider for therapy.  Transportation Means: Pt reports access to transportation  Supports: No supports mentioned at this time  Chad CordialLauren Carter, LCSWA 10/07/2015 9:31 AM

## 2015-10-07 NOTE — Progress Notes (Signed)
D:Patient in the hallway on approach.  Patient appears brighter today.  Patient states she had a good day.  Patient states she was able to speak to her wife today and they will have a family session Wednesday.  Patient states her depression is better.  Patient denies SI/HI and denies AVH A: Staff to monitor Q 15 mins for safety.  Encouragement and support offered.  Scheduled medications administered per orders. R: Patient remains safe on the unit.  Patient attended group tonight.  Patient visible on the unit and interacting with peers.  Patient taking adminisitered medications.

## 2015-10-07 NOTE — Progress Notes (Addendum)
D:  Patient's self inventory sheet, patient has fair sleep, sleep medication may be helpful.  Fair appetite, normal energy level, good concentration.  Rated depression and anxiety 5, hopeless 3.  Denied withdrawals.  Denied SI.  Physical problems, sharp pain in foot, worst pain in past 24 hours is #2, neck, foot, no pain medication.  Goal is "deciding how to work on marriage without getting depressed again.  Talk to SW.  Need to note to boss about missing work, need to schedule appointments with therapist and AD from [facility].  No discharge plans.  A:  Medications administered per MD orders.  Emotional support and encouragement given patient. R:  Denied SI and HI, contracts for safety.  Denied A/V hallucinations.  Safety maintained with 15 minute checks.

## 2015-10-07 NOTE — Progress Notes (Signed)
[facility] MD Progress Note  10/07/2015 5:13 PM Joanie Duprey  MRN:  [id number] Subjective:  Patient reports some improvement and is not feeling as depressed today. States she does continue to feel " upset " and sad in particular related to marital tension , distancing. She spoke at length about this issue today- states they have become more distant as they both focus on their careers and on their children, and have little time for each other. States that there has been no intimacy with spouse in " a long time". Her spouse also feels rejected by patient, which patient feels is because " when I am depressed I am less approachable, more distant", and marital tension in turn worsens her depression, resulting in a vicious cycle . Objective : I have reviewed chart notes and met with patient . Today presents with partially improved mood and range of affect, but still depressed, ruminative, particularly ( as above ) related to marital issues, which she states are main contributor to her depression. At this time denies medication side effects. Denies any suicidal ideations . On unit she is calm,pleasant on approach, visible in milieu, going to groups. Labs - TSH WNL.   Principal Problem: MDD (major depressive disorder), recurrent severe, without psychosis ([place]) Diagnosis:   Patient Active Problem List   Diagnosis Date Noted  . MDD (major depressive disorder), recurrent severe, without psychosis ([place]) [F33.2] 10/05/2015   Total Time spent with patient: 25 minutes     Past Medical History:  Past Medical History  Diagnosis Date  . Allergy   . Depression   . Diabetes mellitus without complication ([place])     gestational  . Anxiety     Past Surgical History  Procedure Laterality Date  . Bunionectomy     Family History:  Family History  Problem Relation Age of Onset  . Osteoporosis Mother   . Post-traumatic stress disorder Father   . Drug abuse Sister   . Epilepsy Brother   . Food Allergy Son    . Arthritis Maternal Grandmother   . Osteoporosis Maternal Grandmother   . Graves' disease Maternal Grandfather   . Cancer Maternal Grandfather   . Diabetes Paternal Grandfather   . Cancer Paternal Grandfather     Social History:  History  Alcohol Use No     History  Drug Use No    Social History   Social History  . Marital Status: Single    Spouse Name: N/A  . Number of Children: N/A  . Years of Education: N/A   Social History Main Topics  . Smoking status: Never Smoker   . Smokeless tobacco: Never Used  . Alcohol Use: No  . Drug Use: No  . Sexual Activity:    Partners: Female     Comment: same sex partner   Other Topics Concern  . None   Social History Narrative   Additional Social History:   Sleep: improving on Remeron   Appetite:  Good  Current Medications: Current Facility-Administered Medications  Medication Dose Route Frequency Provider Last Rate Last Dose  . acetaminophen (TYLENOL) tablet 650 mg  650 mg Oral Q6H PRN Lurena Nida, NP   650 mg at 10/07/15 0745  . DULoxetine (CYMBALTA) DR capsule 60 mg  60 mg Oral BID Jenne Campus, MD   60 mg at 10/07/15 0742  . loratadine (CLARITIN) tablet 10 mg  10 mg Oral Daily Lurena Nida, NP   10 mg at 10/07/15 0742  . LORazepam (ATIVAN)  tablet 0.5 mg  0.5 mg Oral Q6H PRN Jenne Campus, MD      . mirtazapine (REMERON) tablet 15 mg  15 mg Oral QHS Jenne Campus, MD   15 mg at 10/06/15 2059    Lab Results:  Results for orders placed or performed during the hospital encounter of 10/05/15 (from the past 48 hour(s))  TSH     Status: None   Collection Time: 10/07/15  6:11 AM  Result Value Ref Range   TSH 1.611 0.350 - 4.500 uIU/mL    Comment: Performed at [facility]    Blood Alcohol level:  Lab Results  Component Value Date   [facility] <5 10/05/2015    Physical Findings: AIMS: Facial and Oral Movements Muscles of Facial Expression: None, normal Lips and Perioral Area: None,  normal Jaw: None, normal Tongue: None, normal,Extremity Movements Upper (arms, wrists, hands, fingers): None, normal Lower (legs, knees, ankles, toes): None, normal, Trunk Movements Neck, shoulders, hips: None, normal, Overall Severity Severity of abnormal movements (highest score from questions above): None, normal Incapacitation due to abnormal movements: None, normal Patient's awareness of abnormal movements (rate only patient's report): No Awareness, Dental Status Current problems with teeth and/or dentures?: No Does patient usually wear dentures?: No  CIWA:  CIWA-Ar Total: 1 COWS:  COWS Total Score: 2  Musculoskeletal: Strength & Muscle Tone: within normal limits Gait & Station: normal Patient leans: N/A  Psychiatric Specialty Exam: Physical Exam  ROS no headache, no chest pain, no shortness of breath, no vomiting , no rash   Blood pressure 122/68, pulse 89, temperature 98.2 F (36.8 C), temperature source Oral, resp. rate 18, height 5' 2.5" (1.588 m), weight 139 lb (63.05 kg).Body mass index is 25 kg/(m^2).  General Appearance: Well Groomed  Eye Contact:  Good  Speech:  Normal Rate  Volume:  Normal  Mood:  remains depressed, but improved compared to admission   Affect:  Appropriate, not tearful, less constricted today  Thought Process:  Linear  Orientation:  Full (Time, Place, and Person)  Thought Content:  Denies hallucinations,no delusions   Suicidal Thoughts:  No denies any suicidal or self injurious ideations   Homicidal Thoughts:  No denies any homicidal or violent ideations, specifically also denies any violent ideations towards wife   Memory:  recent and remote grossly intact   Judgement:  Other:  improving   Insight:  Present  Psychomotor Activity:  Normal  Concentration:  Concentration: Good and Attention Span: Good  Recall:  Good  Fund of Knowledge:  Good  Language:  Good  Akathisia:  Negative  Handed:  Right  AIMS (if indicated):     Assets:  Desire for  Improvement Resilience  ADL's:  Intact  Cognition:  WNL  Sleep:  Number of Hours: 6.25   Assessment - patient presents partially improved compared to admission, affect less constricted and more reactive, no SI today. Remains ruminative about her marital relationship , which is he major stressor at this time. At this time tolerating Cymbalta and Remeron well   Treatment Plan Summary: Daily contact with patient to assess and evaluate symptoms and progress in treatment, Medication management, Plan inpatient admission and medications as below Encourage ongoing group , milieu participation to work on coping skills and symptom reduction  Continue Cymbalta 60 mgrs BID for depression Continue Remeron 15 mgrs QHS for depression and insomnia Continue Ativan 0.5 mgrs Q 6 hours PRN for anxiety as needed  Family meeting with spouse and patient tentatively  scheduled for Wed, at patient's request    Neita Garnet, MD 10/07/2015, 5:13 PM

## 2015-10-08 NOTE — Progress Notes (Signed)
D: Pt presents with flat affect and depressed mood. Pt rates depression 2/10. Anxiety 2/10. Pt denies suicidal thoughts. Pt verbalized that she's trying to work on ways to communicate with her wife when she's upset, instead of shutting down. Pt verbalized that she's looking forward to her family session tomorrow and discharging home. Pt reports good sleep and appetite. Pt appears well groomed and hygiene is WNL. Pt compliant with attending groups and taking meds.  A: Medications administered as ordered per MD. Verbal support provided. Pt encouraged to attend groups. 15 minute checks performed for safety. R: Pt receptive to tx.

## 2015-10-08 NOTE — Progress Notes (Signed)
Patient ID: Suzanne Hanson, female   DOB: 15-Jan-1976, 40 y.o.   MRN: [id number] [facility] MD Progress Note  10/08/2015 5:27 PM Breleigh Carpino  MRN:  [id number] Subjective:  Patient reports feeling better, and compared to admission, presents with  Improved mood and range of affect. Denies any suicidal ideations  States medications are helping and currently does not endorse medication side effects. She is less ruminative about her psychosocial stressors, mainly marital difficulties, and is future oriented .  Objective : I have reviewed chart notes and met with patient . Presents improved compared to admission - improved mood, improved range of affect, less anxious, no SI. Family meeting with spouse scheduled for tomorrow- may discharge tomorrow as well, as she continues to stabilize and improve  Tolerating medications well ( currently on Cymbalta, dose recently increased, and on Remeron , new medication trial)  Going to groups, behavior on unit calm, pleasant, socializing with peers . Labs- TSH WNL.    Principal Problem: MDD (major depressive disorder), recurrent severe, without psychosis ([place]) Diagnosis:   Patient Active Problem List   Diagnosis Date Noted  . MDD (major depressive disorder), recurrent severe, without psychosis ([place]) [F33.2] 10/05/2015   Total Time spent with patient: 20 minutes     Past Medical History:  Past Medical History  Diagnosis Date  . Allergy   . Depression   . Diabetes mellitus without complication ([place])     gestational  . Anxiety     Past Surgical History  Procedure Laterality Date  . Bunionectomy     Family History:  Family History  Problem Relation Age of Onset  . Osteoporosis Mother   . Post-traumatic stress disorder Father   . Drug abuse Sister   . Epilepsy Brother   . Food Allergy Son   . Arthritis Maternal Grandmother   . Osteoporosis Maternal Grandmother   . Graves' disease Maternal Grandfather   . Cancer Maternal Grandfather   .  Diabetes Paternal Grandfather   . Cancer Paternal Grandfather     Social History:  History  Alcohol Use No     History  Drug Use No    Social History   Social History  . Marital Status: Single    Spouse Name: N/A  . Number of Children: N/A  . Years of Education: N/A   Social History Main Topics  . Smoking status: Never Smoker   . Smokeless tobacco: Never Used  . Alcohol Use: No  . Drug Use: No  . Sexual Activity:    Partners: Female     Comment: same sex partner   Other Topics Concern  . None   Social History Narrative   Additional Social History:   Sleep: improving on Remeron   Appetite:  Good  Current Medications: Current Facility-Administered Medications  Medication Dose Route Frequency Provider Last Rate Last Dose  . acetaminophen (TYLENOL) tablet 650 mg  650 mg Oral Q6H PRN Lurena Nida, NP   650 mg at 10/07/15 0745  . DULoxetine (CYMBALTA) DR capsule 60 mg  60 mg Oral BID Jenne Campus, MD   60 mg at 10/08/15 1708  . loratadine (CLARITIN) tablet 10 mg  10 mg Oral Daily Lurena Nida, NP   10 mg at 10/08/15 0756  . LORazepam (ATIVAN) tablet 0.5 mg  0.5 mg Oral Q6H PRN Jenne Campus, MD      . mirtazapine (REMERON) tablet 15 mg  15 mg Oral QHS Jenne Campus, MD   15 mg at  10/07/15 2122    Lab Results:  Results for orders placed or performed during the hospital encounter of 10/05/15 (from the past 48 hour(s))  TSH     Status: None   Collection Time: 10/07/15  6:11 AM  Result Value Ref Range   TSH 1.611 0.350 - 4.500 uIU/mL    Comment: Performed at [facility]    Blood Alcohol level:  Lab Results  Component Value Date   [facility] <5 10/05/2015    Physical Findings: AIMS: Facial and Oral Movements Muscles of Facial Expression: None, normal Lips and Perioral Area: None, normal Jaw: None, normal Tongue: None, normal,Extremity Movements Upper (arms, wrists, hands, fingers): None, normal Lower (legs, knees, ankles, toes):  None, normal, Trunk Movements Neck, shoulders, hips: None, normal, Overall Severity Severity of abnormal movements (highest score from questions above): None, normal Incapacitation due to abnormal movements: None, normal Patient's awareness of abnormal movements (rate only patient's report): No Awareness, Dental Status Current problems with teeth and/or dentures?: No Does patient usually wear dentures?: No  CIWA:  CIWA-Ar Total: 1 COWS:  COWS Total Score: 2  Musculoskeletal: Strength & Muscle Tone: within normal limits Gait & Station: normal Patient leans: N/A  Psychiatric Specialty Exam: Physical Exam  ROS no headache, no chest pain, no shortness of breath, no vomiting , no rash   Blood pressure 123/75, pulse 91, temperature 97.8 F (36.6 C), temperature source Oral, resp. rate 16, height 5' 2.5" (1.588 m), weight 139 lb (63.05 kg).Body mass index is 25 kg/(m^2).  General Appearance: Well Groomed  Eye Contact:  Good  Speech:  Normal Rate  Volume:  Normal  Mood: improving, less depressed   Affect:  Appropriate, more reactive   Thought Process:  Linear  Orientation:  Full (Time, Place, and Person)  Thought Content:  Denies hallucinations,no delusions   Suicidal Thoughts:  No denies any suicidal or self injurious ideations   Homicidal Thoughts:  No denies any homicidal or violent ideations, specifically also denies any violent ideations towards wife   Memory:  recent and remote grossly intact   Judgement:  Other:  improving   Insight:  Present  Psychomotor Activity:  Normal  Concentration:  Concentration: Good and Attention Span: Good  Recall:  Good  Fund of Knowledge:  Good  Language:  Good  Akathisia:  Negative  Handed:  Right  AIMS (if indicated):     Assets:  Desire for Improvement Resilience  ADL's:  Intact  Cognition:  WNL  Sleep:  Number of Hours: 6   Assessment - patient has improved significantly compared to admission- affect is fuller in range, no longer  tearful, mood is described as improved, and she is tolerating medication adjustments well thus far. She describes chronic marital stressors ( mainly emotional distancing, lack of intimacy, feelings of rejection ) , but states she is committed to marriage- family meeting tomorrow, as per her request . Tolerating Cymbalta, Remeron well at present   Treatment Plan Summary: Daily contact with patient to assess and evaluate symptoms and progress in treatment, Medication management, Plan inpatient admission and medications as below Encourage ongoing group , milieu participation to work on coping skills and symptom reduction  Continue Cymbalta 60 mgrs BID for depression Continue Remeron 15 mgrs QHS for depression and insomnia Continue Ativan 0.5 mgrs Q 6 hours PRN for anxiety as needed  Family meeting with spouse and patient tentatively scheduled for Wed, at patient's request  Treatment team working on disposition planning    Britanni Yarde,  Felicita Gage, MD 10/08/2015, 5:27 PM

## 2015-10-08 NOTE — [facility] Group Notes (Signed)
[facility] LCSW Group Therapy 10/08/2015 1:15 PM  Type of Therapy: Group Therapy- Feelings about Diagnosis  Participation Level: Active   Participation Quality:  Appropriate  Affect:  Appropriate  Cognitive: Alert and Oriented   Insight:  Developing   Engagement in Therapy: Developing/Improving and Engaged   Modes of Intervention: Clarification, Confrontation, Discussion, Education, Exploration, Limit-setting, Orientation, Problem-solving, Rapport Building, Dance movement psychotherapisteality Testing, Socialization and Support  Description of Group:   This group will allow patients to explore their thoughts and feelings about diagnoses they have received. Patients will be guided to explore their level of understanding and acceptance of these diagnoses. Facilitator will encourage patients to process their thoughts and feelings about the reactions of others to their diagnosis, and will guide patients in identifying ways to discuss their diagnosis with significant others in their lives. This group will be process-oriented, with patients participating in exploration of their own experiences as well as giving and receiving support and challenge from other group members.  Summary of Progress/Problems:  Pt discussed how her mental health makes her relationships difficult; she also discussed her difficulty identifying her "crisis" point and described how she feels that it takes only "small" triggers to make her suicidal. Pt processed the need to be more aware of her emotions and to be proactive and preventative in treatment.  Therapeutic Modalities:   Cognitive Behavioral Therapy Solution Focused Therapy Motivational Interviewing Relapse Prevention Therapy  Suzanne CordialLauren Hanson, LCSWA 10/08/2015 3:37 PM

## 2015-10-08 NOTE — Progress Notes (Signed)
[facility] Group Notes:  (Nursing/MHT/Case Management/Adjunct)  Date:  10/08/2015  Time:  2100  Type of Therapy:  wrap up group  Participation Level:  Active  Participation Quality:  Appropriate, Attentive, Sharing and Supportive  Affect:  Appropriate  Cognitive:  Appropriate  Insight:  Improving  Engagement in Group:  Engaged  Modes of Intervention:  Clarification, Education and Support  Summary of Progress/Problems: Pt shared that during her depression she isolates and pushes people closest to her away by not communicating for long periods of time. Pt shared that she has supports but there are some "hurt feelings" from lack of communication and pt plans on having more open communication going forward.   Marcille BuffyMcNeil, Adean Milosevic S 10/08/2015, 9:36 PM

## 2015-10-08 NOTE — Progress Notes (Signed)
Pt attended spiritual care group on grief and loss facilitated by chaplain Danyell Awbrey   Group opened with brief discussion and psycho-social ed around grief and loss in relationships and in relation to self - identifying life patterns, circumstances, changes that cause losses. Established group norm of speaking from own life experience. Group goal of establishing open and affirming space for members to share loss and experience with grief, normalize grief experience and provide psycho social education and grief support.     

## 2015-10-08 NOTE — Progress Notes (Signed)
Adult Psychoeducational Group Note  Date:  10/08/2015 Time:  0830   Group Topic/Focus:  Orientation:   The focus of this group is to educate the patient on the purpose and policies of crisis stabilization and provide a format to answer questions about their admission.  The group details unit policies and expectations of patients while admitted.  Participation Level:  Active  Participation Quality:  Appropriate  Affect:  Appropriate  Cognitive:  Appropriate  Insight: Appropriate  Engagement in Group:  Engaged  Modes of Intervention:  Discussion and Orientation  Additional Comments:    Shanicka Oldenkamp L 10/08/2015, 1:02 PM

## 2015-10-08 NOTE — [facility] Suicide Risk Assessment (Signed)
[facility] INPATIENT:  Family/Significant Other Suicide Prevention Education  Suicide Prevention Education:  Education Completed; Suzanne CoryCarolyn Hanson, Pt's wife [phone], has been identified by the patient as the family member/significant other with whom the patient will be residing, and identified as the person(s) who will aid the patient in the event of a mental health crisis (suicidal ideations/suicide attempt).  With written consent from the patient, the family member/significant other has been provided the following suicide prevention education, prior to the and/or following the discharge of the patient.  The suicide prevention education provided includes the following:  Suicide risk factors  Suicide prevention and interventions  National Suicide Hotline telephone number  [facility] assessment telephone number  [facility] Emergency Assistance 911  [facility] and/or Residential Mobile Crisis Unit telephone number  Request made of family/significant other to:  Remove weapons (e.g., guns, rifles, knives), all items previously/currently identified as safety concern.    Remove drugs/medications (over-the-counter, prescriptions, illicit drugs), all items previously/currently identified as a safety concern.  The family member/significant other verbalizes understanding of the suicide prevention education information provided.  The family member/significant other agrees to remove the items of safety concern listed above.  Suzanne Hanson, Suzanne Hanson 10/08/2015, 9:07 AM

## 2015-10-09 MED ORDER — DULOXETINE HCL 60 MG PO CPEP: 60.0000 mg | ORAL_CAPSULE | Freq: Two times a day (BID) | ORAL | Status: DC

## 2015-10-09 MED ORDER — MIRTAZAPINE 15 MG PO TABS: 15.0000 mg | ORAL_TABLET | Freq: Every day | ORAL | Status: DC

## 2015-10-09 MED ORDER — LORATADINE 10 MG PO TABS: 10.0000 mg | ORAL_TABLET | Freq: Every day | ORAL | Status: DC

## 2015-10-09 NOTE — Progress Notes (Signed)
  [facility] :  Will you be returning to the same living situation after discharge:  Yes,  Pt returning home with wife At discharge, do you have transportation home?: Yes,  wife to provide transportation Do you have the ability to pay for your medications: Yes,  Pt provided with prescriptions  Release of information consent forms completed and in the chart;  Patient's signature needed at discharge.  Patient to Follow up at: Follow-up Information    Follow up with [facility].   Why:  The office has been closed Monday-Wednesday for the holiday and moving. Please call Thursday to schedule your next appointment with your therapist and to be put on the schedule for medication management   Contact information:   [address], [place], [place] [address]  Phone # [phone]  Fax # [phone]      Next level of care provider has access to [facility] Link:no  Safety Planning and Suicide Prevention discussed: Yes,  with wife; see SPE note  Have you used any form of tobacco in the last 30 days? (Cigarettes, Smokeless Tobacco, Cigars, and/or Pipes): No  Has patient been referred to the Quitline?: N/A patient is not a smoker  Patient has been referred for addiction treatment: N/A  Elaina HoopsCarter, Analucia Hush M 10/09/2015, 9:26 AM

## 2015-10-09 NOTE — Progress Notes (Signed)
D: Pt was in the day room upon initial approach.  Pt has appropriate affect and anxious mood.  She reports her day has been "good."  Pt reports the best part of her day was teaching a peer how to play spades.  Pt report she is discharging tomorrow after her wife comes for a family session.  Pt reports she feels safe to discharge tomorrow.  She reports she and her wife are going to try to work things out and "we will both be okay if things don't work out."  Pt reports aftercare appointment with her therapist on Thursday and an appointment with her wife and therapist next week.  Pt denies SI/HI, denies hallucinations, denies pain.  She has been visible in the milieu interacting with peers and staff appropriately.      A: Introduced self to pt.  Actively listened to pt and offered support and encouragement.  Medication administered per order.  Medication education provided.    R: Pt is compliant with scheduled medication.  She verbally contracts for safety.  Pt reports she will inform staff of needs and concerns.  Will continue to monitor and assess.

## 2015-10-09 NOTE — Progress Notes (Signed)
Patient verbalizes readiness for discharge. Follow up plan explained, Rx's given. All belongings returned. Patient verbalizes understanding. Denies SI/HI and assures this Clinical research associatewriter she will seek assistance should that change. Patient discharged ambulatory and in stable condition. Patient's car in parking lot and will drive herself home.

## 2015-10-09 NOTE — Progress Notes (Signed)
Met with patient 1:1. Patient exhibits slight anxiety in affect with congruent mood. Rates it at a 3/10, hopelessness and depression both at a 0/10. Rates sleep, appetite and concentration as "good" and energy as "normal." Reports her goal is to "remain calm and empathetic during family meeting with my wife; stay positive." "Remember I am a kind person who just needs to learn to be a better communicator." Denying pain however complaining of what she thinks might be a side effect from meds. States she noticed her hands being slightly shaky during breakfast. Chose to not take cymbalta until she could speak with Dr. Parke Poisson. Also remarked she does not plan to take remeron after discharge even though states she slept well with it. Encouraged to speak with Dr. Parke Poisson regarding medications. Explained side effects often subside over time. Educated patient about remeron's dual purpose - to promote sleep as well as its antidepressant benefits. Emotional support offered. Self inventory reviewed. She denies SI/HI and remains safe on level III obs.

## 2015-10-09 NOTE — Discharge Summary (Signed)
Physician Discharge Summary Note  Patient:  Suzanne Hanson is an 40 y.o., female MRN:  [id number] DOB:  11-09-1975 Patient phone:  [phone] (home)  Patient address:   [address] [place] [id number],   Total Time spent with patient: Greater than 30 minutes  Date of Admission:  10/05/2015  Date of Discharge: 10-09-15  Reason for Admission: Worsening symptoms of depression  Principal Problem: MDD (major depressive disorder), recurrent severe, without psychosis [facility](HCC)  Discharge Diagnoses: Patient Active Problem List   Diagnosis Date Noted  . MDD (major depressive disorder), recurrent severe, without psychosis (HCC) [F33.2] 10/05/2015   Past Psychiatric History: Major depression  Past Medical History:  Past Medical History  Diagnosis Date  . Allergy   . Depression   . Diabetes mellitus without complication (HCC)     gestational  . Anxiety     Past Surgical History  Procedure Laterality Date  . Bunionectomy     Family History:  Family History  Problem Relation Age of Onset  . Osteoporosis Mother   . Post-traumatic stress disorder Father   . Drug abuse Sister   . Epilepsy Brother   . Food Allergy Son   . Arthritis Maternal Grandmother   . Osteoporosis Maternal Grandmother   . Graves' disease Maternal Grandfather   . Cancer Maternal Grandfather   . Diabetes Paternal Grandfather   . Cancer Paternal Grandfather    Family Psychiatric  History: See H&P  Social History:  History  Alcohol Use No     History  Drug Use No    Social History   Social History  . Marital Status: Single    Spouse Name: N/A  . Number of Children: N/A  . Years of Education: N/A   Social History Main Topics  . Smoking status: Never Smoker   . Smokeless tobacco: Never Used  . Alcohol Use: No  . Drug Use: No  . Sexual Activity:    Partners: Female     Comment: same sex partner   Other Topics Concern  . None   Social History Narrative   Hospital Course: 40 year old  marriedfemale, employed. Reportsshe has been feeling increasingly depressed for several weeks . States that she has been having some marital difficulties, and had recently not been offered a job that she really wanted. Reports she has been feeling sad, depressed , and recently developed some suicidal ideations, with some thoughts of crashing her bicycle on purpose, particularlyafter her wife told her that her depression and irritability was negatively affecting their relationship. States " I decided I needed to come in to the hospital".  Suzanne Hans[facility] with complaints of worsening symptoms of depression triggering suicidal ideations with plans to crash her bicycle on purpose. She cited work & relationship related stressors as the trigger. She was in need of mood stabilization treatments. During the course of hospitalization, Suzanne Hanson was medicated & discharged on, Duloxetine 60 mg for depression & Mirtazapine 15 mg for depression/insomnia. She was enrolled & participated in the group counseling sessions being offered & held on this unit. She was counseled & learned coping skills that should help her cope better & maintain mood stability after discharge. She was resumed on all her pertinent home medications for the other previously existing medical issues (allergies). She tolerated her treatment regimen without any adverse effects reported. While her treatment was on going, Suzanne Hanson's improvement was monitored by observation & her daily reports of symptom reduction noted.  Her emotional &  mental status were monitored by daily self-inventory reports completed by her & the clinical staff.         Suzanne Hanson was evaluated daily by the treatment team for mood stability & the need for continued recovery after discharge. Her motivation was an integral factor in her recovery & mood stability. She was offered further treatment options upon discharge & will follow up with the outpatient  psychiatric services as listed below.     Upon discharge, Suzanne Hanson was both mentally & medically stable for discharge. She is currently denying suicidal, homicidal ideation, auditory, visual/tactile hallucinations, delusional thoughts & or paranoia. Suzanne Hanson left [facility] with all personal belongings in no apparent distress. Transportation per family.  Physical Findings: AIMS: Facial and Oral Movements Muscles of Facial Expression: None, normal Lips and Perioral Area: None, normal Jaw: None, normal Tongue: None, normal,Extremity Movements Upper (arms, wrists, hands, fingers): None, normal Lower (legs, knees, ankles, toes): None, normal, Trunk Movements Neck, shoulders, hips: None, normal, Overall Severity Severity of abnormal movements (highest score from questions above): None, normal Incapacitation due to abnormal movements: None, normal Patient's awareness of abnormal movements (rate only patient's report): No Awareness, Dental Status Current problems with teeth and/or dentures?: No Does patient usually wear dentures?: No  CIWA:  CIWA-Ar Total: 1 COWS:  COWS Total Score: 2  Musculoskeletal: Strength & Muscle Tone: within normal limits Gait & Station: normal Patient leans: N/A  Psychiatric Specialty Exam: Physical Exam  Constitutional: She is oriented to person, place, and time. She appears well-developed.  HENT:  Head: Normocephalic.  Eyes: Pupils are equal, round, and reactive to light.  Neck: Normal range of motion.  Cardiovascular: Normal rate.   Respiratory: Effort normal.  GI: Soft.  Genitourinary:  Denies any issues in this area  Musculoskeletal: Normal range of motion.  Neurological: She is alert and oriented to person, place, and time.  Skin: Skin is warm and dry.    Review of Systems  Constitutional: Negative.   HENT: Negative.   Eyes: Negative.   Respiratory: Negative.   Cardiovascular: Negative.   Gastrointestinal: Negative.   Genitourinary: Negative.    Musculoskeletal: Negative.   Skin: Negative.   Neurological: Negative.   Endo/Heme/Allergies: Negative.   Psychiatric/Behavioral: Positive for depression (Stable). Negative for suicidal ideas, hallucinations, memory loss and substance abuse. The patient has insomnia (Stable). The patient is not nervous/anxious.     Blood pressure 123/80, pulse 99, temperature 98 F (36.7 C), temperature source Oral, resp. rate 16, height 5' 2.5" (1.588 m), weight 63.05 kg (139 lb).Body mass index is 25 kg/(m^2).  See Md's SRA   Have you used any form of tobacco in the last 30 days? (Cigarettes, Smokeless Tobacco, Cigars, and/or Pipes): No  Has this patient used any form of tobacco in the last 30 days? (Cigarettes, Smokeless Tobacco, Cigars, and/or Pipes): No  Blood Alcohol level:  Lab Results  Component Value Date   ETH <5 10/05/2015   Metabolic Disorder Labs:  No results found for: HGBA1C, MPG No results found for: PROLACTIN No results found for: CHOL, TRIG, HDL, CHOLHDL, VLDL, LDLCALC  See Psychiatric Specialty Exam and Suicide Risk Assessment completed by Attending Physician prior to discharge.  Discharge destination:  Home  Is patient on multiple antipsychotic therapies at discharge:  No   Has Patient had three or more failed trials of antipsychotic monotherapy by history:  No  Recommended Plan for Multiple Antipsychotic Therapies: NA    Medication List    STOP taking these medications  azelastine 0.1 % nasal spray  Commonly known as:  ASTELIN      TAKE these medications      Indication   DULoxetine 60 MG capsule  Commonly known as:  CYMBALTA  Take 1 capsule (60 mg total) by mouth 2 (two) times daily. For depression   Indication:  Major Depressive Disorder     loratadine 10 MG tablet  Commonly known as:  CLARITIN  Take 1 tablet (10 mg total) by mouth daily. For allergies   Indication:  Perennial Rhinitis, Hayfever     mirtazapine 15 MG tablet  Commonly known as:   REMERON  Take 1 tablet (15 mg total) by mouth at bedtime. For depression/sleep   Indication:  Trouble Sleeping, Major Depressive Disorder       Follow-up Information    Follow up with [facility].   Why:  The office has been closed Monday-Wednesday for the holiday and moving. Please call Thursday to schedule your next appointment with your therapist and to be put on the schedule for medication management   Contact information:   [address], [place], [place] [address]  Phone # [phone]  Fax # [phone]     Follow-up recommendations: Activity:  As tolerated Diet: As recommended by your primary care doctor. Keep all scheduled follow-up appointments as recommended.    Comments: Patient is instructed prior to discharge to: Take all medications as prescribed by his/her mental healthcare provider. Report any adverse effects and or reactions from the medicines to his/her outpatient provider promptly. Patient has been instructed & cautioned: To not engage in alcohol and or illegal drug use while on prescription medicines. In the event of worsening symptoms, patient is instructed to call the crisis hotline, 911 and or go to the nearest ED for appropriate evaluation and treatment of symptoms. To follow-up with his/her primary care provider for your other medical issues, concerns and or health care needs.   Signed: Sanjuana Kava, NP, PMHNP, FNP-BC 10/09/2015, 11:06 AM  Patient seen, Suicide Assessment Completed.  Disposition Plan Reviewed

## 2015-10-09 NOTE — Tx Team (Signed)
Interdisciplinary Treatment Plan Update (Adult) Date: 10/09/2015   Date: 10/09/2015 9:22 AM  Progress in Treatment:  Attending groups: Yes  Participating in groups: Yes Taking medication as prescribed: Yes  Tolerating medication: Yes  Family/Significant othe contact made: Yes with wife Patient understands diagnosis: Yes AEB  Discussing patient identified problems/goals with staff: Yes  Medical problems stabilized or resolved: Yes  Denies suicidal/homicidal ideation: Yes Patient has not harmed self or Others: Yes   New problem(s) identified: None identified at this time.   Discharge Plan or Barriers: Pt will return home and follow-up with [facility]  Additional comments:  Patient and CSW reviewed pt's identified goals and treatment plan. Patient verbalized understanding and agreed to treatment plan. CSW reviewed [facility] "Discharge Process and Patient Involvement" Form. Pt verbalized understanding of information provided and signed form.   Reason for Continuation of Hospitalization:  Anxiety Depression Medication stabilization Suicidal ideation  Estimated length of stay: 0 days  Review of initial/current patient goals per problem list:   1.  Goal(s): Patient will participate in aftercare plan  Met:  Yes  Target date: 3-5 days from date of admission   As evidenced by: Patient will participate within aftercare plan AEB aftercare provider and housing plan at discharge being identified.   10/07/15: Pt will return home and follow-up with [facility] [place]  2.  Goal (s): Patient will exhibit decreased depressive symptoms and suicidal ideations.  Met:  Yes  Target date: 3-5 days from date of admission   As evidenced by: Patient will utilize self rating of depression at 3 or below and demonstrate decreased signs of depression or be deemed stable for discharge by MD.  10/07/15: Pt reports improvement in mood and denies SI at this  time. Pt rates depression at 5/10 10/09/2015: Pt rates depression at 0/10; denies SI  3.  Goal(s): Patient will demonstrate decreased signs and symptoms of anxiety.  Met:  Yes  Target date: 3-5 days from date of admission   As evidenced by: Patient will utilize self rating of anxiety at 3 or below and demonstrated decreased signs of anxiety, or be deemed stable for discharge by MD  10/07/15: Pt rates anxiety at 5/10 10/09/2015: Pt rates anxiety at 3/10  Attendees:  Patient:    Family:    Physician: Dr. Parke Poisson, MD  10/09/2015 9:22 AM  Nursing: Lars Pinks, RN Case manager  10/09/2015 9:22 AM  Clinical Social Worker Peri Maris, [place] 10/09/2015 9:22 AM  Other: Erasmo Downer Drinkard, LCSWA 10/09/2015 9:22 AM  Clinical:  Loletta Specter, RN; Grayland Ormond, RN; Desma Paganini, RN 10/09/2015 9:22 AM  Other: , RN Charge Nurse 10/09/2015 9:22 AM  Other: Hilda Lias, [place], [place] Work [phone]

## 2015-10-09 NOTE — [facility] Suicide Risk Assessment (Signed)
[facility]   Principal Problem: MDD (major depressive disorder), recurrent severe, without psychosis (HCC) Discharge Diagnoses:  Patient Active Problem List   Diagnosis Date Noted  . MDD (major depressive disorder), recurrent severe, without psychosis (HCC) [F33.2] 10/05/2015    Total Time spent with patient: 30 minutes  Musculoskeletal: Strength & Muscle Tone: within normal limits Gait & Station: normal Patient leans: N/A  Psychiatric Specialty Exam: ROS  Blood pressure 123/80, pulse 99, temperature 98 F (36.7 C), temperature source Oral, resp. rate 16, height 5' 2.5" (1.588 m), weight 139 lb (63.05 kg).Body mass index is 25 kg/(m^2).  General Appearance: Well Groomed  Eye Contact::  Good  Speech:  Normal Rate409  Volume:  Normal  Mood:  improving   Affect:  Appropriate  Thought Process:  Linear  Orientation:  Full (Time, Place, and Person)  Thought Content:  denies hallucinations, no delusions   Suicidal Thoughts:  No- denies suicidal ideations, denies any self injurious ideations   Homicidal Thoughts:  No denies any homicidal or violent ideations  Memory:  recent and remote grossly intact   Judgement:  Other:  improved   Insight:  improving   Psychomotor Activity:  Normal  Concentration:  Good  Recall:  Good  Fund of Knowledge:Good  Language: Good  Akathisia:  Negative  Handed:  Right  AIMS (if indicated):     Assets:  Communication Skills Desire for Improvement Resilience  Sleep:  Number of Hours: 6.25  Cognition: WNL  ADL's:  Intact   Mental Status Per Nursing Assessment::   On Admission:     Demographic Factors:  40 year old married female, three children  Loss Factors: Marital difficulties , employment related stressors , recently applied for a job and did not get it   Historical Factors: History of depression , anxiety   Risk Reduction Factors:   Responsible for children under 40 years of age, Sense of responsibility  to family, Employed, Living with another person, especially a relative and Positive coping skills or problem solving skills  Continued Clinical Symptoms:  At this time patient is alert and attentive, well related, calm, pleasant, mood is improved compared to admission . Affect more reactive .  No thought disorder, no SI or HI, no psychotic symptoms, future oriented. Of  Note, at patient's request, we had family meeting with her spouse today ( along with Clinical research associatewriter and CSW) . Meeting went well, spouse reiterated her support for patient and commitment to relationship- they discussed how stressors ( parenting, demanding jobs,  Financial difficulties) have contributed to marital distancing and reviewed ways to improve communication and spend more quality time together. Medications reviewed, patient states she wants to stop Remeron , because she feels she may not need it and is concerned about side effect profile, such as possible weight gain, sedation    Cognitive Features That Contribute To Risk:  No gross cognitive deficits noted upon discharge. Is alert , attentive, and oriented x 3   Suicide Risk:  Mild:  Suicidal ideation of limited frequency, intensity, duration, and specificity.  There are no identifiable plans, no associated intent, mild dysphoria and related symptoms, good self-control (both objective and subjective assessment), few other risk factors, and identifiable protective factors, including available and accessible social support.  Follow-up Information    Follow up with [facility].   Why:  The office has been closed Monday-Wednesday for the holiday and moving. Please call Thursday to schedule your next appointment with your  therapist and to be put on the schedule for medication management   Contact information:   [address], [place], [place] [address]  Phone # [phone]  Fax # [phone]      Plan Of Care/Follow-up  recommendations:  Activity:  as tolerated  Diet:  Regular Tests:  NA Other:  See below  Patient leaving unit in good spirits  Plans to return home  Follow up as below  Nehemiah Massed, MD 10/09/2015, 3:45 PM

## 2015-10-09 NOTE — Progress Notes (Signed)
Recreation Therapy Notes  Date: 05.31.2017 Time: 9:30am Location: [address] Group Room   Group Topic: Stress Management  Goal Area(s) Addresses:  Patient will actively participate in stress management techniques presented during session.   Behavioral Response: Did not attend.   Omar Orrego L Jannifer Fischler, LRT/CTRS        Maralyn Witherell L 10/09/2015 2:05 PM 

## 2015-10-09 NOTE — Plan of Care (Signed)
Problem: Health Behavior/Discharge Planning: Goal: Compliance with therapeutic regimen will improve Outcome: Progressing Pt has been compliant with medications tonight.

## 2015-11-23 ENCOUNTER — Other Ambulatory Visit: Payer: Self-pay | Admitting: Family Medicine

## 2015-11-23 DIAGNOSIS — Z1231 Encounter for screening mammogram for malignant neoplasm of breast: Secondary | ICD-10-CM

## 2016-01-14 ENCOUNTER — Ambulatory Visit
Admission: RE | Admit: 2016-01-14 | Discharge: 2016-01-14 | Disposition: A | Discharge status: Home / Self Care | Payer: BC Managed Care – PPO | Source: Ambulatory Visit | Attending: Family Medicine | Admitting: Family Medicine

## 2016-01-14 DIAGNOSIS — Z1231 Encounter for screening mammogram for malignant neoplasm of breast: Secondary | ICD-10-CM

## 2016-05-25 ENCOUNTER — Encounter (HOSPITAL_COMMUNITY): Payer: Self-pay

## 2016-05-25 ENCOUNTER — Emergency Department (HOSPITAL_COMMUNITY)
Admission: EM | Admit: 2016-05-25 | Discharge: 2016-05-26 | Disposition: A | Discharge status: Other Acute Inpatient Hospital | Payer: BC Managed Care – PPO | Attending: Emergency Medicine | Admitting: Emergency Medicine

## 2016-05-25 DIAGNOSIS — F332 Major depressive disorder, recurrent severe without psychotic features: Secondary | ICD-10-CM | POA: Diagnosis not present

## 2016-05-25 DIAGNOSIS — Z8262 Family history of osteoporosis: Secondary | ICD-10-CM | POA: Diagnosis not present

## 2016-05-25 DIAGNOSIS — Z9889 Other specified postprocedural states: Secondary | ICD-10-CM | POA: Diagnosis not present

## 2016-05-25 DIAGNOSIS — Z813 Family history of other psychoactive substance abuse and dependence: Secondary | ICD-10-CM | POA: Diagnosis not present

## 2016-05-25 DIAGNOSIS — Z79899 Other long term (current) drug therapy: Secondary | ICD-10-CM | POA: Diagnosis not present

## 2016-05-25 DIAGNOSIS — F329 Major depressive disorder, single episode, unspecified: Secondary | ICD-10-CM | POA: Diagnosis present

## 2016-05-25 HISTORY — DX: Prediabetes: R73.03

## 2016-05-25 LAB — COMPREHENSIVE METABOLIC PANEL
ALK PHOS: 37 U/L — AB (ref 38–126)
ALT: 13 U/L — ABNORMAL LOW (ref 14–54)
AST: 17 U/L (ref 15–41)
Albumin: 4.5 g/dL (ref 3.5–5.0)
Anion gap: 7 (ref 5–15)
BUN: 11 mg/dL (ref 6–20)
CHLORIDE: 102 mmol/L (ref 101–111)
CO2: 28 mmol/L (ref 22–32)
CREATININE: 0.69 mg/dL (ref 0.44–1.00)
Calcium: 9.2 mg/dL (ref 8.9–10.3)
GFR calc Af Amer: >60 mL/min (ref >60)
Glucose, Bld: 96 mg/dL (ref 65–99)
Potassium: 3.7 mmol/L (ref 3.5–5.1)
SODIUM: 137 mmol/L (ref 135–145)
Total Bilirubin: 0.6 mg/dL (ref 0.3–1.2)
Total Protein: 7.2 g/dL (ref 6.5–8.1)

## 2016-05-25 LAB — CBC WITH DIFFERENTIAL/PLATELET
Basophils Absolute: 0 10*3/uL (ref 0.0–0.1)
Basophils Relative: 1 %
Eosinophils Absolute: 0 10*3/uL (ref 0.0–0.7)
Eosinophils Relative: 0 %
HCT: 36.7 % (ref 36.0–46.0)
Hemoglobin: 12.4 g/dL (ref 12.0–15.0)
LYMPHS ABS: 1.5 10*3/uL (ref 0.7–4.0)
LYMPHS PCT: 28 %
MCH: 30 pg (ref 26.0–34.0)
MCHC: 33.8 g/dL (ref 30.0–36.0)
MCV: 88.9 fL (ref 78.0–100.0)
MONO ABS: 0.2 10*3/uL (ref 0.1–1.0)
MONOS PCT: 4 %
Neutro Abs: 3.7 10*3/uL (ref 1.7–7.7)
Neutrophils Relative %: 67 %
PLATELETS: 250 10*3/uL (ref 150–400)
RBC: 4.13 MIL/uL (ref 3.87–5.11)
RDW: 12.3 % (ref 11.5–15.5)
WBC: 5.6 10*3/uL (ref 4.0–10.5)

## 2016-05-25 LAB — ETHANOL: Alcohol, Ethyl (B): <5 mg/dL (ref <5)

## 2016-05-25 LAB — RAPID URINE DRUG SCREEN, HOSP PERFORMED
AMPHETAMINES: NOT DETECTED
Barbiturates: NOT DETECTED
Benzodiazepines: NOT DETECTED
Cocaine: NOT DETECTED
Opiates: NOT DETECTED
Tetrahydrocannabinol: NOT DETECTED

## 2016-05-25 MED ORDER — HYDROXYZINE HCL 25 MG PO TABS: 25.0000 mg | ORAL_TABLET | Freq: Every evening | ORAL | Status: DC | PRN

## 2016-05-25 MED ORDER — MIRTAZAPINE 30 MG PO TABS
15.0000 mg | ORAL_TABLET | Freq: Every day | ORAL | Status: DC
  Administered 2016-05-25: 15 mg via ORAL
  Filled 2016-05-25: qty 1

## 2016-05-25 MED ORDER — ZOLPIDEM TARTRATE 5 MG PO TABS: 5.0000 mg | ORAL_TABLET | Freq: Every evening | ORAL | Status: DC | PRN

## 2016-05-25 MED ORDER — ACETAMINOPHEN 325 MG PO TABS: 650.0000 mg | ORAL_TABLET | ORAL | Status: DC | PRN

## 2016-05-25 MED ORDER — DULOXETINE HCL 30 MG PO CPEP: 60.0000 mg | ORAL_CAPSULE | Freq: Two times a day (BID) | ORAL | Status: DC

## 2016-05-25 MED ORDER — ONDANSETRON HCL 4 MG PO TABS: 4.0000 mg | ORAL_TABLET | Freq: Three times a day (TID) | ORAL | Status: DC | PRN

## 2016-05-25 MED ORDER — CITALOPRAM HYDROBROMIDE 10 MG PO TABS
40.0000 mg | ORAL_TABLET | Freq: Every evening | ORAL | Status: DC
  Administered 2016-05-25: 40 mg via ORAL
  Filled 2016-05-25: qty 4

## 2016-05-25 MED ORDER — LORATADINE 10 MG PO TABS
10.0000 mg | ORAL_TABLET | Freq: Every day | ORAL | Status: DC
  Administered 2016-05-25: 10 mg via ORAL
  Filled 2016-05-25 (×2): qty 1

## 2016-05-25 MED ORDER — HYDROXYZINE PAMOATE 25 MG PO CAPS
25.0000 mg | ORAL_CAPSULE | Freq: Every evening | ORAL | Status: DC | PRN
  Filled 2016-05-25: qty 1

## 2016-05-25 NOTE — ED Provider Notes (Addendum)
[facility]-EMERGENCY DEPT Provider Note   CSN: [id number] Arrival date & time: 05/25/16  1219     History   Chief Complaint Chief Complaint  Patient presents with  . Depression    HPI Suzanne Hanson is a 41 y.o. female.  HPI  PT comes in with cc of depression. Pt has no medical problems. Reports that she has been depressed for past several months, and her dose was increased last week by her outpatient doctor - but still she feels like she is getting worse and has SI now. Last admission was in Oct. Pt denies nausea, emesis, fevers, chills, chest pains, shortness of breath, headaches, abdominal pain, uti like symptoms.    Past Medical History:  Diagnosis Date  . Allergy   . Anxiety   . Depression   . Prediabetes     Patient Active Problem List   Diagnosis Date Noted  . MDD (major depressive disorder), recurrent severe, without psychosis (HCC) 10/05/2015    Past Surgical History:  Procedure Laterality Date  . BUNIONECTOMY      OB History    Gravida Para Term Preterm AB Living   1             SAB TAB Ectopic Multiple Live Births                   Home Medications    Prior to Admission medications   Medication Sig Start Date End Date Taking? Authorizing Provider  Brexpiprazole (REXULTI) 2 MG TABS Take 2 mg by mouth daily.   Yes Historical Provider, MD  Brexpiprazole (REXULTI) 2 MG TABS Take 2 mg by mouth at bedtime.   Yes Historical Provider, MD  citalopram (CELEXA) 20 MG tablet Take 40 mg by mouth every evening.  04/23/16  Yes Historical Provider, MD  hydrOXYzine (VISTARIL) 25 MG capsule Take 25 mg by mouth at bedtime as needed for anxiety. 04/08/16  Yes Historical Provider, MD  loratadine (CLARITIN) 10 MG tablet Take 1 tablet (10 mg total) by mouth daily. For allergies 10/09/15  Yes Sanjuana KavaAgnes I Nwoko, NP  citalopram (CELEXA) 40 MG tablet Take 40 mg by mouth daily.    Historical Provider, MD  DULoxetine (CYMBALTA) 60 MG capsule Take 1 capsule (60 mg total) by mouth 2  (two) times daily. For depression 10/09/15   Sanjuana KavaAgnes I Nwoko, NP  mirtazapine (REMERON) 15 MG tablet Take 1 tablet (15 mg total) by mouth at bedtime. For depression/sleep 10/09/15   Sanjuana KavaAgnes I Nwoko, NP    Family History Family History  Problem Relation Age of Onset  . Osteoporosis Mother   . Post-traumatic stress disorder Father   . Drug abuse Sister   . Epilepsy Brother   . Food Allergy Son   . Arthritis Maternal Grandmother   . Osteoporosis Maternal Grandmother   . Graves' disease Maternal Grandfather   . Cancer Maternal Grandfather   . Diabetes Paternal Grandfather   . Cancer Paternal Grandfather     Social History Social History  Substance Use Topics  . Smoking status: Never Smoker  . Smokeless tobacco: Never Used  . Alcohol use No     Allergies   Patient has no known allergies.   Review of Systems Review of Systems  ROS 10 Systems reviewed and are negative for acute change except as noted in the HPI.     Physical Exam Updated Vital Signs BP 119/60 (BP Location: Right Arm)   Pulse 69   Temp 98.5 F (36.9 C) (Oral)  Resp 18   Ht 5\' 2"  (1.575 m)   Wt 140 lb (63.5 kg)   LMP 05/11/2016   SpO2 96%   BMI 25.61 kg/m   Physical Exam  Constitutional: She is oriented to person, place, and time. She appears well-developed.  HENT:  Head: Normocephalic and atraumatic.  Eyes: EOM are normal.  Neck: Normal range of motion. Neck supple.  Cardiovascular: Normal rate.   Pulmonary/Chest: Effort normal.  Abdominal: Bowel sounds are normal.  Neurological: She is alert and oriented to person, place, and time.  Skin: Skin is warm and dry.  Psychiatric: Her behavior is normal. Thought content normal.  Nursing note and vitals reviewed.    ED Treatments / Results  Labs (all labs ordered are listed, but only abnormal results are displayed) Labs Reviewed  CBC WITH DIFFERENTIAL/PLATELET  COMPREHENSIVE METABOLIC PANEL  ETHANOL  RAPID URINE DRUG SCREEN, HOSP PERFORMED      EKG  EKG Interpretation None       Radiology No results found.  Procedures Procedures (including critical care time)  Medications Ordered in ED Medications - No data to display   Initial Impression / Assessment and Plan / ED Course  I have reviewed the triage vital signs and the nursing notes.  Pertinent labs & imaging results that were available during my care of the patient were reviewed by me and considered in my medical decision making (see chart for details).  Clinical Course    Pt comes in with depression and SI. She is medically cleared for psych evaluation.   Final Clinical Impressions(s) / ED Diagnoses   Final diagnoses:  Severe episode of recurrent major depressive disorder, without psychotic features [facility])    New Prescriptions New Prescriptions   No medications on file     Derwood Kaplan, MD 05/25/16 1329    Derwood Kaplan, MD 06/10/16 0120

## 2016-05-25 NOTE — BH Assessment (Addendum)
Assessment Note  Suzanne Hanson is an 41 y.o. female with history of depression and anxiety. [facility] with increased symptoms of depression. She reports hopeless, fatigue, and loss of interest in usual pleasures. She recently started to experience suicidal ideations over the weekend. She has a plan to go in the woods and "slit her ceratoid". However, denies intent. Trigger for current suicidal thoughts are related to a recent separation. Patient sts that she and her wife have decided to divorce. They have children and they split custody 50/50. Patient has loss a lot of friends because of the divorce. Patient also doesn't like her job. She works at [employer] and [employer]'t feel that she will be able to find another job. She denies HI. No history of violent or aggressive behaviors. No AVH's. [facility]-10[facility]09[facility]2015. She is currently seeking outpatient mental health services with Rickey PrimusLisa Polus, NP (med management and Baltazar ApoCathy Showfety (therapy). Patient denies alcohol and drug use.   Diagnosis: Major Depressive Disorder, Recurrent, Severe, without psychotic features; Anxiety Disorder  Past Medical History:  Past Medical History:  Diagnosis Date  . Allergy   . Anxiety   . Depression   . Prediabetes     Past Surgical History:  Procedure Laterality Date  . BUNIONECTOMY      Family History:  Family History  Problem Relation Age of Onset  . Osteoporosis Mother   . Post-traumatic stress disorder Father   . Drug abuse Sister   . Epilepsy Brother   . Food Allergy Son   . Arthritis Maternal Grandmother   . Osteoporosis Maternal Grandmother   . Graves' disease Maternal Grandfather   . Cancer Maternal Grandfather   . Diabetes Paternal Grandfather   . Cancer Paternal Grandfather     Social History:  reports that she has never smoked. She has never used smokeless tobacco. She reports that she does not drink alcohol or use drugs.  Additional Social History:   Alcohol / Drug Use Pain Medications: See MAR Prescriptions: See MAR Over the Counter: See MAR  CIWA: CIWA-Ar BP: 119/60 Pulse Rate: 69 COWS:    Allergies: No Known Allergies  Home Medications:  (Not in a hospital admission)  OB/GYN Status:  Patient's last menstrual period was 05/11/2016.  General Assessment Data [facility] of Assessment: [facility] TTS Assessment: In system Is this a Tele or Face-to-Face Assessment?: Face-to-Face Is this an Initial Assessment or a Re-assessment for this encounter?: Initial Assessment Marital status: Separated Maiden name:  Allean Found(Shawhan) Is patient pregnant?: No Pregnancy Status: No Living Arrangements: Other (Comment), Alone (alone with children "1/2 time") Can pt return to current living arrangement?: No Admission Status: Voluntary Is patient capable of signing voluntary admission?: No Referral Source: Self/Family/Friend Insurance type:  Herbalist(BCBS)     Crisis Care Plan Living Arrangements: Other (Comment), Alone (alone with children "1/2 time") Legal Guardian: Other: (no legal guardian ) Name of Psychiatrist:  (Dr. Betti Cruzeddy ) Name of Therapist:  Baltazar Apo(Cathy Showfety)  Education Status Is patient currently in school?: No Current Grade:  (n/a) Highest grade of school patient has completed:  (Masters Degree) Name of school:  (n/a) Contact person:  (n/a)  Risk to self with the past 6 months Suicidal Ideation: Yes-Currently Present Has patient been a risk to self within the past 6 months prior to admission? : Yes Suicidal Intent: Yes-Currently Present Has patient had any suicidal intent within the past 6 months prior to admission? : Yes Is patient at risk for suicide?: Yes Suicidal  Plan?: Yes-Currently Present Has patient had any suicidal plan within the past 6 months prior to admission? : Yes Specify Current Suicidal Plan:  ("Going into the woods and cutting my caratoid artery") Access to Means: Yes Specify Access to Suicidal Means:  (sharp  objects) What has been your use of drugs/alcohol within the last 12 months?:  (social alcohol use...last drink was over a month ago ) Previous Attempts/Gestures: No How many times?:  (0) Other Self Harm Risks:  (none reported) Triggers for Past Attempts: Other (Comment) (no previous attempts or gestures ) Intentional Self Injurious Behavior: None Family Suicide History: Yes (sister-social anxiety and depression ) Recent stressful life event(s): Other (Comment) ("Not liking my job.Marland KitchenMarland KitchenDon't feel like I have many options") Persecutory voices/beliefs?: No Depression: Yes Depression Symptoms: Feeling angry/irritable, Loss of interest in usual pleasures, Feeling worthless/self pity, Guilt, Fatigue, Isolating, Tearfulness, Insomnia Substance abuse history and/or treatment for substance abuse?: No Suicide prevention information given to non-admitted patients: Not applicable  Risk to Others within the past 6 months Homicidal Ideation: No Does patient have any lifetime risk of violence toward others beyond the six months prior to admission? : No Thoughts of Harm to Others: No Current Homicidal Intent: No Current Homicidal Plan: No Access to Homicidal Means: No Identified Victim:  (n/a) History of harm to others?: No Assessment of Violence: None Noted Violent Behavior Description:  (currently calm and cooperative ) Does patient have access to weapons?: No Criminal Charges Pending?: No Does patient have a court date: No Is patient on probation?: No  Psychosis Hallucinations:  (none reported) Delusions:  (none reported)  Mental Status Report Appearance/Hygiene: In scrubs Eye Contact: Good Motor Activity: Freedom of movement Speech: Logical/coherent Level of Consciousness: Alert Mood: Depressed Affect: Appropriate to circumstance Anxiety Level: None Thought Processes: Relevant, Coherent Judgement: Impaired Orientation: Person, Situation, Time, Place Obsessive Compulsive  Thoughts/Behaviors: None  Cognitive Functioning Concentration: Decreased Memory: Recent Intact, Remote Intact IQ: Average Insight: Poor Impulse Control: Poor Appetite: Poor Weight Loss:  ("a little..not sure how much ") Weight Gain:  (none reported) Sleep: Decreased Total Hours of Sleep:  ("10 or 12 when I don't have my children") Vegetative Symptoms: None  ADLScreening [facility] Assessment Services) Patient's cognitive ability adequate to safely complete daily activities?: Yes Patient able to express need for assistance with ADLs?: Yes Independently performs ADLs?: Yes (appropriate for developmental age)  Prior Inpatient Therapy Prior Inpatient Therapy: Yes Prior Therapy Dates:  (10/05/2015-10/09/2015) Prior Therapy Facilty/Provider(s):  [facility]) Reason for Treatment:  (Major Depressive Disorder, Recurrent, )  Prior Outpatient Therapy Prior Outpatient Therapy: Yes Prior Therapy Dates:  (current) Prior Therapy Facilty/Provider(s):  (Dr. Reddy/ Rickey Primus, NP and [facility]) Reason for Treatment:  (med managment and therapy ) Does patient have an ACCT team?: No Does patient have Intensive In-House Services?  : No Does patient have [facility] services? : No Does patient have P4CC services?: No  ADL Screening (condition at time of admission) Patient's cognitive ability adequate to safely complete daily activities?: Yes Is the patient deaf or have difficulty hearing?: No Does the patient have difficulty seeing, even when wearing glasses/contacts?: No Does the patient have difficulty concentrating, remembering, or making decisions?: No Patient able to express need for assistance with ADLs?: Yes Does the patient have difficulty dressing or bathing?: No Independently performs ADLs?: Yes (appropriate for developmental age) Does the patient have difficulty walking or climbing stairs?: No Weakness of Legs: None Weakness of Arms/Hands: None  Home Assistive Devices/Equipment Home Assistive  Devices/Equipment: None  Abuse/Neglect Assessment (Assessment to be complete while patient is alone) Physical Abuse: Denies Verbal Abuse: Denies Sexual Abuse: Denies Exploitation of patient/patient's resources: Denies Self-Neglect: Denies Values / Beliefs Cultural Requests During Hospitalization: None Spiritual Requests During Hospitalization: None   Advance Directives (For Healthcare) Does Patient Have a Medical Advance Directive?: No Would patient like information on creating a medical advance directive?: No - Patient declined Nutrition Screen- [facility] Patient's home diet: Regular  Additional Information 1:1 In Past 12 Months?: No CIRT Risk: No Elopement Risk: No Does patient have medical clearance?: Yes     Disposition: Patient meets criteria for OBS unit, per Nanine Means, DNP.  Disposition Initial Assessment Completed for this Encounter: Yes  On Site Evaluation by:   Reviewed with Physician:  Nanine Means, DNP  Melynda Ripple 05/25/2016 2:15 PM

## 2016-05-25 NOTE — ED Triage Notes (Signed)
Pt depression has been worsening over the past week, having SI thoughts on and off over the weekend, states that she had thoughts to "slit my carotid", but has no plan to act on the thoughts, has been on Celexa 20mg  x1 month, dose was increased to 40mg  last week, reports that the depression seemed to worsen instead of improving after starting on Celexa, stressors include going through divorce, losing friends because of divorce and has a "job that I hate and [place]

## 2016-05-26 ENCOUNTER — Encounter (HOSPITAL_COMMUNITY): Payer: Self-pay | Admitting: *Deleted

## 2016-05-26 ENCOUNTER — Inpatient Hospital Stay (HOSPITAL_COMMUNITY)
Admission: AD | Admit: 2016-05-26 | Discharge: 2016-05-31 | DRG: 885 | Disposition: A | Discharge status: Home / Self Care | Payer: BC Managed Care – PPO | Source: Intra-hospital | Attending: Psychiatry | Admitting: Psychiatry

## 2016-05-26 DIAGNOSIS — R45851 Suicidal ideations: Secondary | ICD-10-CM | POA: Diagnosis present

## 2016-05-26 DIAGNOSIS — Z8262 Family history of osteoporosis: Secondary | ICD-10-CM | POA: Diagnosis not present

## 2016-05-26 DIAGNOSIS — Z813 Family history of other psychoactive substance abuse and dependence: Secondary | ICD-10-CM

## 2016-05-26 DIAGNOSIS — Z8261 Family history of arthritis: Secondary | ICD-10-CM

## 2016-05-26 DIAGNOSIS — Z79899 Other long term (current) drug therapy: Secondary | ICD-10-CM

## 2016-05-26 DIAGNOSIS — F411 Generalized anxiety disorder: Secondary | ICD-10-CM | POA: Diagnosis present

## 2016-05-26 DIAGNOSIS — Z833 Family history of diabetes mellitus: Secondary | ICD-10-CM

## 2016-05-26 DIAGNOSIS — Z9889 Other specified postprocedural states: Secondary | ICD-10-CM | POA: Diagnosis not present

## 2016-05-26 DIAGNOSIS — F332 Major depressive disorder, recurrent severe without psychotic features: Secondary | ICD-10-CM | POA: Diagnosis present

## 2016-05-26 DIAGNOSIS — Z818 Family history of other mental and behavioral disorders: Secondary | ICD-10-CM | POA: Diagnosis not present

## 2016-05-26 DIAGNOSIS — R7303 Prediabetes: Secondary | ICD-10-CM | POA: Diagnosis present

## 2016-05-26 DIAGNOSIS — Z808 Family history of malignant neoplasm of other organs or systems: Secondary | ICD-10-CM

## 2016-05-26 MED ORDER — HYDROXYZINE HCL 25 MG PO TABS: 25.0000 mg | ORAL_TABLET | Freq: Every evening | ORAL | Status: DC | PRN

## 2016-05-26 MED ORDER — CITALOPRAM HYDROBROMIDE 40 MG PO TABS
40.0000 mg | ORAL_TABLET | Freq: Every evening | ORAL | Status: DC
  Administered 2016-05-26 – 2016-05-30 (×5): 40 mg via ORAL
  Filled 2016-05-26 (×6): qty 1
  Filled 2016-05-26: qty 2
  Filled 2016-05-26: qty 1

## 2016-05-26 MED ORDER — ACETAMINOPHEN 325 MG PO TABS
650.0000 mg | ORAL_TABLET | ORAL | Status: DC | PRN
Start: 2016-05-26 — End: 2016-05-31

## 2016-05-26 MED ORDER — MIRTAZAPINE 15 MG PO TABS
15.0000 mg | ORAL_TABLET | Freq: Every day | ORAL | Status: DC
  Administered 2016-05-26 – 2016-05-30 (×5): 15 mg via ORAL
  Filled 2016-05-26 (×8): qty 1

## 2016-05-26 MED ORDER — ONDANSETRON HCL 4 MG PO TABS: 4.0000 mg | ORAL_TABLET | Freq: Three times a day (TID) | ORAL | Status: DC | PRN

## 2016-05-26 MED ORDER — MAGNESIUM HYDROXIDE 400 MG/5ML PO SUSP: 30.0000 mL | Freq: Every day | ORAL | Status: DC | PRN

## 2016-05-26 MED ORDER — ALUM & MAG HYDROXIDE-SIMETH 200-200-20 MG/5ML PO SUSP
30.0000 mL | ORAL | Status: DC | PRN
  Administered 2016-05-29 – 2016-05-30 (×2): 30 mL via ORAL
  Filled 2016-05-26 (×2): qty 30

## 2016-05-26 MED ORDER — LORATADINE 10 MG PO TABS
10.0000 mg | ORAL_TABLET | Freq: Every day | ORAL | Status: DC
  Administered 2016-05-27 – 2016-05-31 (×5): 10 mg via ORAL
  Filled 2016-05-26 (×8): qty 1

## 2016-05-26 NOTE — Tx Team (Signed)
Initial Treatment Plan 05/26/2016 7:06 PM Suzanne Hanson [place] WGN:[id number]    PATIENT STRESSORS: Loss of wife (Separation) Marital or family conflict Occupational concerns   PATIENT STRENGTHS: Ability for insight Barrister's clerkCommunication skills Motivation for treatment/growth Physical Health Supportive family/friends   PATIENT IDENTIFIED PROBLEMS: At risk for suicide  Depression  "Plan for job search"  "Coping skills for when I'm really depressed and don't want to do anything"                DISCHARGE CRITERIA:  Ability to meet basic life and health needs Improved stabilization in mood, thinking, and/or behavior Motivation to continue treatment in a less acute level of care Need for constant or close observation no longer present  PRELIMINARY DISCHARGE PLAN: Outpatient therapy Return to previous living arrangement Return to previous work or school arrangements  PATIENT/FAMILY INVOLVEMENT: This treatment plan has been presented to and reviewed with the patient, Suzanne Hanson [place].  The patient and family have been given the opportunity to ask questions and make suggestions.  Carleene OverlieMiddleton, Shareese Macha P, RN 05/26/2016, 7:06 PM

## 2016-05-26 NOTE — ED Notes (Signed)
[facility] been called [facility]. Drivers are out of area at this time and will try to call in extra staff, if not then she anticipates a driver to arrive around [id number].

## 2016-05-26 NOTE — Progress Notes (Signed)
Adult Psychoeducational Group Note  Date:  05/26/2016 Time:  11:43 PM  Group Topic/Focus:  Wrap-Up Group:   The focus of this group is to help patients review their daily goal of treatment and discuss progress on daily workbooks.   Participation Level:  Active  Participation Quality:  Appropriate  Affect:  Appropriate  Cognitive:  Alert  Insight: Appropriate  Engagement in Group:  Engaged  Modes of Intervention:  Discussion  Additional Comments:  Patient states, "I had an okay day". Anaih Brander L Autymn Omlor 05/26/2016, 11:43 PM

## 2016-05-26 NOTE — ED Notes (Signed)
Per Maisie Fushomas, therapeutic triage, states [facility] and will go to bed 406-01. Call report to [phone], pt to be transferred via [employer].

## 2016-05-26 NOTE — Consult Note (Signed)
[facility] Face-to-Face Psychiatry Consult   Reason for Consult:  Suicidal ideations with plan to cut her throat Referring Physician:  EDP Patient Identification: Suzanne Hanson MRN:  [id number] Principal Diagnosis: MDD (major depressive disorder), recurrent severe, without psychosis ([place]) Diagnosis:   Patient Active Problem List   Diagnosis Date Noted  . MDD (major depressive disorder), recurrent severe, without psychosis ([place]) [F33.2] 10/05/2015    Priority: High    Total Time spent with patient: 45 minutes  Subjective:   Suzanne Hanson is a 41 y.o. female patient admitted with suicide plan.  HPI:  41 yo female who presented to the ED with suicidal ideations and plan to "slit my carotid."  Many stressors of divorce, losing friends due to divorce, jobless, and financial issues.  Sleep has been fair.  She goes to [facility] and sees [employer].  Her Celexa was just increased but appeared to make her depression worse.  She has an 41 year-old and 41 year-old twins.  Past Psychiatric History: depression   Risk to Self: Suicidal Ideation: Yes-Currently Present Suicidal Intent: Yes-Currently Present Is patient at risk for suicide?: Yes Suicidal Plan?: Yes-Currently Present Specify Current Suicidal Plan:  ("Going into the woods and cutting my caratoid artery") Access to Means: Yes Specify Access to Suicidal Means:  (sharp objects) What has been your use of drugs/alcohol within the last 12 months?:  (social alcohol use...last drink was over a month ago ) How many times?:  (0) Other Self Harm Risks:  (none reported) Triggers for Past Attempts: Other (Comment) (no previous attempts or gestures ) Intentional Self Injurious Behavior: None Risk to Others: Homicidal Ideation: No Thoughts of Harm to Others: No Current Homicidal Intent: No Current Homicidal Plan: No Access to Homicidal Means: No Identified Victim:  (n/a) History of harm to others?: No Assessment of Violence: None  Noted Violent Behavior Description:  (currently calm and cooperative ) Does patient have access to weapons?: No Criminal Charges Pending?: No Does patient have a court date: No Prior Inpatient Therapy: Prior Inpatient Therapy: Yes Prior Therapy Dates:  (10/05/2015-10/09/2015) Prior Therapy Facilty/Provider(s):  [facility]) Reason for Treatment:  (Major Depressive Disorder, Recurrent, ) Prior Outpatient Therapy: Prior Outpatient Therapy: Yes Prior Therapy Dates:  (current) Prior Therapy Facilty/Provider(s):  (Dr. Reddy/ Ala Bent, NP and [facility]) Reason for Treatment:  (med managment and therapy ) Does patient have an ACCT team?: No Does patient have Intensive In-House Services?  : No Does patient have [facility] services? : No Does patient have P4CC services?: No  Past Medical History:  Past Medical History:  Diagnosis Date  . Allergy   . Anxiety   . Depression   . Prediabetes     Past Surgical History:  Procedure Laterality Date  . BUNIONECTOMY     Family History:  Family History  Problem Relation Age of Onset  . Osteoporosis Mother   . Post-traumatic stress disorder Father   . Drug abuse Sister   . Epilepsy Brother   . Food Allergy Son   . Arthritis Maternal Grandmother   . Osteoporosis Maternal Grandmother   . Graves' disease Maternal Grandfather   . Cancer Maternal Grandfather   . Diabetes Paternal Grandfather   . Cancer Paternal Grandfather    Family Psychiatric  History: none Social History:  History  Alcohol Use No     History  Drug Use No    Social History   Social History  . Marital status: Single    Spouse name: N/A  . Number of  children: N/A  . Years of education: N/A   Social History Main Topics  . Smoking status: Never Smoker  . Smokeless tobacco: Never Used  . Alcohol use No  . Drug use: No  . Sexual activity: Yes    Partners: Female     Comment: same sex partner   Other Topics Concern  . Not on file   Social History Narrative  . No  narrative on file   Additional Social History:    Allergies:  No Known Allergies  Labs:  Results for orders placed or performed during the hospital encounter of 05/25/16 (from the past 48 hour(s))  Comprehensive metabolic panel     Status: Abnormal   Collection Time: 05/25/16 12:55 PM  Result Value Ref Range   Sodium 137 135 - 145 mmol/L   Potassium 3.7 3.5 - 5.1 mmol/L   Chloride 102 101 - 111 mmol/L   CO2 28 22 - 32 mmol/L   Glucose, Bld 96 65 - 99 mg/dL   BUN 11 6 - 20 mg/dL   Creatinine, Ser 0.69 0.44 - 1.00 mg/dL   Calcium 9.2 8.9 - 10.3 mg/dL   Total Protein 7.2 6.5 - 8.1 g/dL   Albumin 4.5 3.5 - 5.0 g/dL   AST 17 15 - 41 U/L   ALT 13 (L) 14 - 54 U/L   Alkaline Phosphatase 37 (L) 38 - 126 U/L   Total Bilirubin 0.6 0.3 - 1.2 mg/dL   GFR calc non Af Amer >60 >60 mL/min   GFR calc Af Amer >60 >60 mL/min    Comment: (NOTE) The eGFR has been calculated using the CKD EPI equation. This calculation has not been validated in all clinical situations. eGFR's persistently <60 mL/min signify possible Chronic Kidney Disease.    Anion gap 7 5 - 15  Ethanol     Status: None   Collection Time: 05/25/16 12:55 PM  Result Value Ref Range   Alcohol, Ethyl (B) <5 <5 mg/dL    Comment:        LOWEST DETECTABLE LIMIT FOR SERUM ALCOHOL IS 5 mg/dL FOR MEDICAL PURPOSES ONLY   CBC with Diff     Status: None   Collection Time: 05/25/16 12:55 PM  Result Value Ref Range   WBC 5.6 4.0 - 10.5 K/uL   RBC 4.13 3.87 - 5.11 MIL/uL   Hemoglobin 12.4 12.0 - 15.0 g/dL   HCT 36.7 36.0 - 46.0 %   MCV 88.9 78.0 - 100.0 fL   MCH 30.0 26.0 - 34.0 pg   MCHC 33.8 30.0 - 36.0 g/dL   RDW 12.3 11.5 - 15.5 %   Platelets 250 150 - 400 K/uL   Neutrophils Relative % 67 %   Neutro Abs 3.7 1.7 - 7.7 K/uL   Lymphocytes Relative 28 %   Lymphs Abs 1.5 0.7 - 4.0 K/uL   Monocytes Relative 4 %   Monocytes Absolute 0.2 0.1 - 1.0 K/uL   Eosinophils Relative 0 %   Eosinophils Absolute 0.0 0.0 - 0.7 K/uL    Basophils Relative 1 %   Basophils Absolute 0.0 0.0 - 0.1 K/uL  Urine rapid drug screen (hosp performed)not at [facility]     Status: None   Collection Time: 05/25/16  1:01 PM  Result Value Ref Range   Opiates NONE DETECTED NONE DETECTED   Cocaine NONE DETECTED NONE DETECTED   Benzodiazepines NONE DETECTED NONE DETECTED   Amphetamines NONE DETECTED NONE DETECTED   Tetrahydrocannabinol NONE DETECTED NONE DETECTED  Barbiturates NONE DETECTED NONE DETECTED    Comment:        DRUG SCREEN FOR MEDICAL PURPOSES ONLY.  IF CONFIRMATION IS NEEDED FOR ANY PURPOSE, NOTIFY LAB WITHIN 5 DAYS.        LOWEST DETECTABLE LIMITS FOR URINE DRUG SCREEN Drug Class       Cutoff (ng/mL) Amphetamine      1000 Barbiturate      200 Benzodiazepine   527 Tricyclics       782 Opiates          300 Cocaine          300 THC              50     Current Facility-Administered Medications  Medication Dose Route Frequency Provider Last Rate Last Dose  . acetaminophen (TYLENOL) tablet 650 mg  650 mg Oral Q4H PRN Varney Biles, MD      . citalopram (CELEXA) tablet 40 mg  40 mg Oral QPM Ankit Nanavati, MD   40 mg at 05/25/16 1735  . hydrOXYzine (ATARAX/VISTARIL) tablet 25 mg  25 mg Oral QHS PRN Varney Biles, MD      . loratadine (CLARITIN) tablet 10 mg  10 mg Oral Daily Ankit Nanavati, MD   10 mg at 05/25/16 1736  . mirtazapine (REMERON) tablet 15 mg  15 mg Oral QHS Varney Biles, MD   15 mg at 05/25/16 2205  . ondansetron (ZOFRAN) tablet 4 mg  4 mg Oral Q8H PRN Ankit Nanavati, MD      . zolpidem (AMBIEN) tablet 5 mg  5 mg Oral QHS PRN Varney Biles, MD       Current Outpatient Prescriptions  Medication Sig Dispense Refill  . Brexpiprazole (REXULTI) 2 MG TABS Take 2 mg by mouth daily.    . Brexpiprazole (REXULTI) 2 MG TABS Take 2 mg by mouth at bedtime.    . citalopram (CELEXA) 20 MG tablet Take 40 mg by mouth every evening.   0  . hydrOXYzine (VISTARIL) 25 MG capsule Take 25 mg by mouth at bedtime as needed for  anxiety.  1  . loratadine (CLARITIN) 10 MG tablet Take 1 tablet (10 mg total) by mouth daily. For allergies    . citalopram (CELEXA) 40 MG tablet Take 40 mg by mouth daily.    . DULoxetine (CYMBALTA) 60 MG capsule Take 1 capsule (60 mg total) by mouth 2 (two) times daily. For depression 60 capsule 0  . mirtazapine (REMERON) 15 MG tablet Take 1 tablet (15 mg total) by mouth at bedtime. For depression/sleep 30 tablet 0    Musculoskeletal: Strength & Muscle Tone: within normal limits Gait & Station: normal Patient leans: N/A  Psychiatric Specialty Exam: Physical Exam  Constitutional: She is oriented to person, place, and time. She appears well-developed and well-nourished.  HENT:  Head: Normocephalic.  Neck: Normal range of motion.  Respiratory: Effort normal.  Musculoskeletal: Normal range of motion.  Neurological: She is alert and oriented to person, place, and time.  Psychiatric: Her speech is normal and behavior is normal. Judgment normal. Cognition and memory are normal. She exhibits a depressed mood. She expresses suicidal ideation. She expresses suicidal plans.    Review of Systems  Psychiatric/Behavioral: Positive for depression and suicidal ideas.  All other systems reviewed and are negative.   Blood pressure (!) 106/50, pulse 84, temperature 99 F (37.2 C), temperature source Oral, resp. rate 19, height '5\' 2"'  (1.575 m), weight 63.5 kg (140 lb), last menstrual  period 05/11/2016, SpO2 96 %.Body mass index is 25.61 kg/m.  General Appearance: Casual  Eye Contact:  Fair  Speech:  Normal Rate  Volume:  Decreased  Mood:  Depressed  Affect:  Depressed  Thought Process:  Coherent and Descriptions of Associations: Intact  Orientation:  Full (Time, Place, and Person)  Thought Content:  Rumination  Suicidal Thoughts:  Yes.  with intent/plan  Homicidal Thoughts:  No  Memory:  Immediate;   Fair Recent;   Fair Remote;   Fair  Judgement:  Impaired  Insight:  Fair  Psychomotor  Activity:  Decreased  Concentration:  Concentration: Fair and Attention Span: Fair  Recall:  [employer] of Knowledge:  Fair  Language:  Good  Akathisia:  No  Handed:  Right  AIMS (if indicated):     Assets:  Leisure Time Physical Health Resilience  ADL's:  Intact  Cognition:  WNL  Sleep:        Treatment Plan Summary: Daily contact with patient to assess and evaluate symptoms and progress in treatment, Medication management and Plan major depressive disorder, recurrent, severe without psychosis:  -Crisis stabilization -Medication management:  Restart medical medications along with Remeron 15 mg at bedtime for sleep, Vistaril 25 mg at bedtime anxiety, and Celexa 40 mg daily for depression -Individual counseling  Disposition: Recommend psychiatric Inpatient admission when medically cleared.  Waylan Boga, NP 05/26/2016 12:12 PM  Patient seen face-to-face for psychiatric evaluation, chart reviewed and case discussed with the physician extender and developed treatment plan. Reviewed the information documented and agree with the treatment plan. Corena Pilgrim, MD

## 2016-05-26 NOTE — ED Notes (Signed)
[facility], nurse unavailable, was told to "call back in 15 minutes."

## 2016-05-26 NOTE — Progress Notes (Signed)
Patient is 41 yrs old, voluntary, depression/anxiety.  SI thoughts over weekend.  Patient and wife decided to divorce recently.   Employment problems at [employer].  Patient shares 50/50 custody with 3 children, age 41 yrs, and twins 41 yrs old.  Patient denied SI and HI, contracts for safety.  Denied A/V hallucinations.  Denied pain.  Rated depression 8, zero anxiety, hopeless #7.  Respirations even and unlabored.  No signs/symptoms of pain/distress noted on patient's face/body movements.  Safety maintained with 15 minute checks.

## 2016-05-26 NOTE — BH Assessment (Addendum)
[facility] Assessment Progress Note  Per Thedore MinsMojeed Akintayo, MD, this pt requires psychiatric hospitalization at this time.  Berneice Heinrichina Tate, RN, [facility].  Pt has signed Voluntary Admission and Consent for Treatment, as well as Consent to Release Information to [facility] SavageLisa Poulos and [facility], as well as pt's mother, and signed forms have [facility].  Pt's nurse has been notified, and agrees to send original paperwork along with pt via [facility], and to call report to [phone].  Doylene Canninghomas Addasyn Mcbreen, MA Triage Specialist [phone]   Addendum:  Notification calls have been placed to pt's outpatient providers.  Doylene Canninghomas Krithi Bray, MA Triage Specialist [phone]

## 2016-05-26 NOTE — Progress Notes (Signed)
Admission Note: Per accepting nurse: "Patient is 41 yrs old, voluntary, depression/anxiety.  SI thoughts over weekend.  Patient and wife decided to divorce recently.   Employment problems at [employer].  Patient shares 50/50 custody with 3 children, age 41 yrs, and twins 655 yrs old.  Patient denied SI and HI, contracts for safety.  Denied A/V hallucinations.  Denied pain.  Rated depression 8, zero anxiety, hopeless #7.  Respirations even and unlabored.  No signs/symptoms of pain/distress noted on patient's face/body movements.  Safety maintained with 15 minute checks."  On admission, patient reports SI with plan to "cut my carotid".  Patient reports main stressors as relationship issues due to a recent separation from wife in July and work stress.  Patient states "I can't work due to depression".  Patient appeared flat and depressed on admission and was calm and cooperative with admission process. [facility], patient would like to "have a plan for a job search" and "Coping skills for when I'm really depressed and don't want to do anything".

## 2016-05-26 NOTE — Plan of Care (Signed)
Problem: Safety: Goal: Periods of time without injury will increase Outcome: Progressing Pt. denies SI/HI/AVH/Pain at this time, remains a low fall risk, Q 15 checks in place for safety.

## 2016-05-26 NOTE — ED Notes (Signed)
[facility]

## 2016-05-26 NOTE — ED Notes (Signed)
[facility] here to transport patient.

## 2016-05-26 NOTE — Progress Notes (Signed)
  DATA ACTION RESPONSE  Objective- Pt. is up and visible in the room, seen reading a book in bed. Pt. presents with an anxious affect and mood. Pt. was guarded with interaction.  Subjective- Denies having any SI/HI/AVH/Pain at this time. Pt. states " I am doing okay, I don't need anything right now". Pt. continues to be cooperative and remain safe & pleasant on the unit.  1:1 interaction in private to establish rapport. Encouragement, education, & support given from staff. Meds. ordered and administered. Pt. had question regarding Rex[employer]i that was supposed to be scheduled @ bedtime.   Safety maintained with Q 15 checks. Continues to follow treatment plan and will monitor closely. No additonal questions/concerns noted.

## 2016-05-26 NOTE — ED Notes (Signed)
Report attempted to [facility]. 

## 2016-05-27 LAB — URINALYSIS, ROUTINE W REFLEX MICROSCOPIC
Bilirubin Urine: NEGATIVE
GLUCOSE, UA: NEGATIVE mg/dL
HGB URINE DIPSTICK: NEGATIVE
Ketones, ur: NEGATIVE mg/dL
Nitrite: NEGATIVE
Protein, ur: NEGATIVE mg/dL
SPECIFIC GRAVITY, URINE: 1.006 (ref 1.005–1.030)
pH: 6 (ref 5.0–8.0)

## 2016-05-27 LAB — PREGNANCY, URINE: PREG TEST UR: NEGATIVE

## 2016-05-27 LAB — TSH: TSH: 0.498 u[IU]/mL (ref 0.350–4.500)

## 2016-05-27 MED ORDER — ARIPIPRAZOLE 5 MG PO TABS
5.0000 mg | ORAL_TABLET | Freq: Every day | ORAL | Status: DC
  Administered 2016-05-27 – 2016-05-31 (×5): 5 mg via ORAL
  Filled 2016-05-27 (×8): qty 1

## 2016-05-27 MED ORDER — BUPROPION HCL ER (SR) 100 MG PO TB12
100.0000 mg | ORAL_TABLET | Freq: Two times a day (BID) | ORAL | Status: DC
  Administered 2016-05-27 – 2016-05-28 (×2): 100 mg via ORAL
  Filled 2016-05-27 (×7): qty 1

## 2016-05-27 NOTE — [facility] Counselor (Signed)
Adult Comprehensive Assessment  Patient ID: Suzanne Hanson, female   DOB: Nov 05, 1975, 79 Y.[place]   MRN: [id number]  Information Source: Information source: Patient  Current Stressors:  Educational / Learning stressors: NA Employment / Job issues: Gainfully employed yet would like to find more meaningful work Family Relationships: recently separated from wife; custody of children split 50/50 Surveyor, quantity / Lack of resources (include bankruptcy): NA Housing / Lack of housing: NA- lives alone Physical health (include injuries & life threatening diseases): Diabetes and depression Social relationships: Feels isolated socially since the separation Substance abuse: Pt denies Bereavement / Loss: Loss of marriage  Living/Environment/Situation:  Living Arrangements: Alone Living conditions (as described by patient or guardian): apartment How long has patient lived in current situation?: a couple of months; since the separation What is atmosphere in current home: Isolative  Family History:  Marital status: Separated Number of Years Married: 16; separated for 2 months What types of issues is patient dealing with in the relationship?: recent separation from wife; stressors unknown Are you sexually active?: No What is your sexual orientation?: Same sex relationship Does patient have children?: Yes How many children?: 3 How is patient's relationship with their children?: most challenging w 24 yo adopted daughter; some stress with demands of parenting her along with two 41 YO twins; relationships are good overall  Childhood History:  By whom was/is the patient raised?: Both parents Additional childhood history information: NA Description of patient's relationship with caregiver when they were a child: Good w both Patient's description of current relationship with people who raised him/her: Good w both Does patient have siblings?: Yes Number of Siblings: 2 Description of patient's current  relationship with siblings: Overall good w both Did patient suffer any verbal/emotional/physical/sexual abuse as a child?: No Did patient suffer from severe childhood neglect?: No Has patient ever been sexually abused/assaulted/raped as an adolescent or adult?: No Was the patient ever a victim of a crime or a disaster?: No Witnessed domestic violence?: No Has patient been effected by domestic violence as an adult?: No  Education:  Highest grade of school patient has completed: 18 Currently a student?: No Learning disability?: No  Employment/Work Situation:   Employment situation: Employed 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 How long has patient been employed?: 2.5 years Patient's job has been impacted by current illness: No Has patient ever been in the [employer]?: No Are There Guns or Other Weapons in Your Home?: No  Financial Resources:   Financial resources: Income from employment, Media planner  Alcohol/Substance Abuse:   What has been your use of drugs/alcohol within the last 12 months?: Occasional Drinker Alcohol/Substance Abuse Treatment Hx: Denies past history Has alcohol/substance abuse ever caused legal problems?: No  Social Support System:   Conservation officer, nature Support System: Fair Museum/gallery exhibitions officer System: Feels abandoned by support system Type of faith/religion: [name] How does patient's faith help to cope with current illness?: Helpful  Leisure/Recreation:   Leisure and Hobbies: Gym  Strengths/Needs:   What things does the patient do well?: Good employee; medication compliant In what areas does patient struggle / problems for patient: Depression, recent separation  Discharge Plan:   Does patient have access to transportation?: Yes Will patient be returning to same living situation after discharge?: Yes Currently receiving community mental health services: Yes (From Whom) ([facility]) Does patient have  financial barriers related to discharge medications?: No  Summary/Recommendations:   Patient is a 41 year old female with a diagnosis of Major Depressive Disorder.  Pt presented to the hospital with thoughts of suicide and increased depression. Pt reports primary trigger(s) for admission include recent separation from wife. Patient will benefit from crisis stabilization, medication evaluation, group therapy and psycho education in addition to case management for discharge planning. At discharge it is recommended that Pt remain compliant with established discharge plan and continued treatment.   Vernie ShanksLauren Quanita Barona, LCSW Clinical Social Work [phone]

## 2016-05-27 NOTE — Progress Notes (Signed)
Patient ID: [name], female   DOB: 12-Dec-1975, 41 y.o.   MRN: [id number]  DAR: Pt. Denies SI/HI and A/V Hallucinations. She reports sleep is good, appetite is good, energy level is low, and concentration is good. She rates depression 6/10, hopelessness 6/10, and anxiety 0/10. Patient does not report any pain or discomfort at this time. Support and encouragement provided to the patient to come to writer with questions or concerns. Patient is minimal with Clinical research associatewriter but cooperative. No behavioral issues noted. Scheduled medications administered to patient per physician's orders. EKG performed and no distress was noted during time of EKG. Patient is seen in the milieu intermittently. Her affect and mood is anxious and depressed. Q15 minute checks are maintained for safety.

## 2016-05-27 NOTE — H&P (Signed)
Psychiatric Admission Assessment Adult  Patient Identification: Suzanne Hanson MRN:  [id number] Date of Evaluation:  05/27/2016 Chief Complaint:  Worsening depression with suicidal thoughts. Principal Diagnosis: MDD recurrent, severe without psychotic features.  Diagnosis:  MDD recurrent severe without psychotic features. Patient Active Problem List   Diagnosis Date Noted  . Major depressive disorder, recurrent severe without psychotic features ([place]) [F33.2] 05/26/2016  . MDD (major depressive disorder), recurrent severe, without psychosis ([place]) [F33.2] 10/05/2015   History of Present Illness:  41 yo Caucasian female, recently separated, lives alone, employed. Background history of MDD recurrent. Self presented to the ER with worsening depression and increasing thoughts of suicide. Reports that she has been preoccupied with thoughts of going into the woods and slit her carotids open.  At interview, patient tells me that she has been diagnosed with depression since her early twenties. Says she has been very down since her relationship became rocky in the past year. She was admitted here in 05/17 for same reason. Says they finally separated in 10/17. Patient reports that she has been more depressed since then. She feels tired most of the time. Say she is bed most of the day. Her mind is muddled most of the time and she is not able to think clearly. Says she has lost interest in things that usually gives her pleasure. She has not been able to exercise lately. She used to enjoy reading a lot. Says she has not been able to focus when reading. Patient reports marked loss of appetite. Says she has lost some weight too. She has not been able to function well at work. Says luckily her boss understands that she is going through a difficult time. Patient states that she has been having thoughts of suicide since September. She has thought about various methods " hanging, running into traffic". Says thoughts of  her children helps her take her mind off these negative thoughts. She reports that her new lifestyle is more stressful. Her parents bought her a house seven minutes away from her ex-wife. Says they are now rotating custody of the kids every two to three days. She is glad the kids are coping well. It has been difficult dealing with friends that work with her ex-wife. Patient has been so down and has not been able to make new friends herself. She reports that her family has been emotionally supportive. No associated substance use. No associated psychotic features. No associated manic features. No associated thoughts of violence. No associated homicidal or infanticidal thoughts.  Associated Signs/Symptoms: Depression Symptoms:  As outlined above (Hypo) Manic Symptoms:  None Anxiety Symptoms:  general rumination and worries about the future Psychotic Symptoms:  None PTSD Symptoms: Negative Total Time spent with patient: 45 minutes  Past Psychiatric History:  Unipolar depression since her early twenties. She did well on Citalopram for a while.  Her first admission was 05/17. She was admitted under similar circumstance. She was stabilized on Duloxetine 60 mg daily  and Mirtazapine 15 mg Hs. She had another admission into [facility] in 09/17. She was having thoughts of slitting her carotid then.  Her medications were adjusted further on out-patient basis. Duloxetine was maximized and she did not respond. She was switched to Brintellix but did not respond. A few days prior to this admission she was switched back to citalopram 40 mg daily and Brexiprixole 2 mg daily.  No past suicidal behavior. No past history of violent behavior. No access to weapons.  She is not getting any psychotherapy  in the community.  No past physical treatment.   Is the patient at risk to self? Yes.    Has the patient been a risk to self in the past 6 months? Yes.    Has the patient been a risk to self within the distant past?  No.  Is the patient a risk to others? No.  Has the patient been a risk to others in the past 6 months? No.  Has the patient been a risk to others within the distant past? No.   Prior Inpatient Therapy:   Prior Outpatient Therapy:    Alcohol Screening: 1. How often do you have a drink containing alcohol?: Monthly or less 2. How many drinks containing alcohol do you have on a typical day when you are drinking?: 1 or 2 3. How often do you have six or more drinks on one occasion?: Never Preliminary Score: 0 9. Have you or someone else been injured as a result of your drinking?: No 10. Has a relative or friend or a doctor or another health worker been concerned about your drinking or suggested you cut down?: No Alcohol Use Disorder Identification Test Final Score (AUDIT): 1 Brief Intervention: AUDIT score less than 7 or less-screening does not suggest unhealthy drinking-brief intervention not indicated Substance Abuse History in the last 12 months:  No. Consequences of Substance Abuse: Negative Previous Psychotropic Medications: Yes  Psychological Evaluations: Yes  Past Medical History:  Past Medical History:  Diagnosis Date  . Allergy   . Anxiety   . Depression   . Prediabetes     Past Surgical History:  Procedure Laterality Date  . BUNIONECTOMY     Family History:  Family History  Problem Relation Age of Onset  . Osteoporosis Mother   . Post-traumatic stress disorder Father   . Drug abuse Sister   . Epilepsy Brother   . Food Allergy Son   . Arthritis Maternal Grandmother   . Osteoporosis Maternal Grandmother   . Graves' disease Maternal Grandfather   . Cancer Maternal Grandfather   . Diabetes Paternal Grandfather   . Cancer Paternal Grandfather    Family Psychiatric  History: Unipolar depression and hypothyroidism Tobacco Screening: Have you used any form of tobacco in the last 30 days? (Cigarettes, Smokeless Tobacco, Cigars, and/or Pipes): No Social History:  History   Alcohol Use No     History  Drug Use No    Additional Social History:  Patient was born and raised in [place]. She was brought up by her biological parents. She reports a good stable home environment. No childhood adversities. She was well adjusted at school. 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 She has been married for 17 years. They have three children aged 31, 56 and 5 years respectively. She is currently employed at [employer]. She does not really love her current position and states that it has been the bone of contention in their marriage. She has not been able to find another position that is as rewarding as her current position.  Patient is not currently in a new relationship. Her parents are very supportive both emotionally and financially. No legal issues. No past Careers adviser. She does not practice any religion.                          Allergies:  No Known Allergies Lab Results:  Results for orders placed or performed during the hospital encounter of 05/25/16 (from  the past 48 hour(s))  Comprehensive metabolic panel     Status: Abnormal   Collection Time: 05/25/16 12:55 PM  Result Value Ref Range   Sodium 137 135 - 145 mmol/L   Potassium 3.7 3.5 - 5.1 mmol/L   Chloride 102 101 - 111 mmol/L   CO2 28 22 - 32 mmol/L   Glucose, Bld 96 65 - 99 mg/dL   BUN 11 6 - 20 mg/dL   Creatinine, Ser 0.69 0.44 - 1.00 mg/dL   Calcium 9.2 8.9 - 10.3 mg/dL   Total Protein 7.2 6.5 - 8.1 g/dL   Albumin 4.5 3.5 - 5.0 g/dL   AST 17 15 - 41 U/L   ALT 13 (L) 14 - 54 U/L   Alkaline Phosphatase 37 (L) 38 - 126 U/L   Total Bilirubin 0.6 0.3 - 1.2 mg/dL   GFR calc non Af Amer >60 >60 mL/min   GFR calc Af Amer >60 >60 mL/min    Comment: (NOTE) The eGFR has been calculated using the CKD EPI equation. This calculation has not been validated in all clinical situations. eGFR's persistently <60 mL/min signify possible Chronic Kidney Disease.    Anion gap 7 5 - 15   Ethanol     Status: None   Collection Time: 05/25/16 12:55 PM  Result Value Ref Range   Alcohol, Ethyl (B) <5 <5 mg/dL    Comment:        LOWEST DETECTABLE LIMIT FOR SERUM ALCOHOL IS 5 mg/dL FOR MEDICAL PURPOSES ONLY   CBC with Diff     Status: None   Collection Time: 05/25/16 12:55 PM  Result Value Ref Range   WBC 5.6 4.0 - 10.5 K/uL   RBC 4.13 3.87 - 5.11 MIL/uL   Hemoglobin 12.4 12.0 - 15.0 g/dL   HCT 36.7 36.0 - 46.0 %   MCV 88.9 78.0 - 100.0 fL   MCH 30.0 26.0 - 34.0 pg   MCHC 33.8 30.0 - 36.0 g/dL   RDW 12.3 11.5 - 15.5 %   Platelets 250 150 - 400 K/uL   Neutrophils Relative % 67 %   Neutro Abs 3.7 1.7 - 7.7 K/uL   Lymphocytes Relative 28 %   Lymphs Abs 1.5 0.7 - 4.0 K/uL   Monocytes Relative 4 %   Monocytes Absolute 0.2 0.1 - 1.0 K/uL   Eosinophils Relative 0 %   Eosinophils Absolute 0.0 0.0 - 0.7 K/uL   Basophils Relative 1 %   Basophils Absolute 0.0 0.0 - 0.1 K/uL  Urine rapid drug screen (hosp performed)not at [facility]     Status: None   Collection Time: 05/25/16  1:01 PM  Result Value Ref Range   Opiates NONE DETECTED NONE DETECTED   Cocaine NONE DETECTED NONE DETECTED   Benzodiazepines NONE DETECTED NONE DETECTED   Amphetamines NONE DETECTED NONE DETECTED   Tetrahydrocannabinol NONE DETECTED NONE DETECTED   Barbiturates NONE DETECTED NONE DETECTED    Comment:        DRUG SCREEN FOR MEDICAL PURPOSES ONLY.  IF CONFIRMATION IS NEEDED FOR ANY PURPOSE, NOTIFY LAB WITHIN 5 DAYS.        LOWEST DETECTABLE LIMITS FOR URINE DRUG SCREEN Drug Class       Cutoff (ng/mL) Amphetamine      1000 Barbiturate      200 Benzodiazepine   923 Tricyclics       300 Opiates          300 Cocaine  300 THC              50     Blood Alcohol level:  Lab Results  Component Value Date   ETH <5 05/25/2016   ETH <5 53/61/4431    Metabolic Disorder Labs:  No results found for: HGBA1C, MPG No results found for: PROLACTIN No results found for: CHOL, TRIG, HDL,  CHOLHDL, VLDL, LDLCALC  Current Medications: Current Facility-Administered Medications  Medication Dose Route Frequency Provider Last Rate Last Dose  . acetaminophen (TYLENOL) tablet 650 mg  650 mg Oral Q4H PRN Patrecia Pour, NP      . alum & mag hydroxide-simeth (MAALOX/MYLANTA) 200-200-20 MG/5ML suspension 30 mL  30 mL Oral Q4H PRN Patrecia Pour, NP      . citalopram (CELEXA) tablet 40 mg  40 mg Oral QPM Patrecia Pour, NP   40 mg at 05/26/16 1819  . hydrOXYzine (ATARAX/VISTARIL) tablet 25 mg  25 mg Oral QHS PRN Patrecia Pour, NP      . loratadine (CLARITIN) tablet 10 mg  10 mg Oral Daily Patrecia Pour, NP   10 mg at 05/27/16 0806  . magnesium hydroxide (MILK OF MAGNESIA) suspension 30 mL  30 mL Oral Daily PRN Patrecia Pour, NP      . mirtazapine (REMERON) tablet 15 mg  15 mg Oral QHS Patrecia Pour, NP   15 mg at 05/26/16 2215  . ondansetron (ZOFRAN) tablet 4 mg  4 mg Oral Q8H PRN Patrecia Pour, NP       PTA Medications: Prescriptions Prior to Admission  Medication Sig Dispense Refill Last Dose  . Brexpiprazole (REXULTI) 2 MG TABS Take 2 mg by mouth daily.   05/24/2016 at Unknown time  . Brexpiprazole (REXULTI) 2 MG TABS Take 2 mg by mouth at bedtime.   05/24/2016 at Unknown time  . citalopram (CELEXA) 20 MG tablet Take 40 mg by mouth every evening.   0 05/24/2016 at Unknown time  . citalopram (CELEXA) 40 MG tablet Take 40 mg by mouth daily.   Not Taking at Unknown time  . DULoxetine (CYMBALTA) 60 MG capsule Take 1 capsule (60 mg total) by mouth 2 (two) times daily. For depression 60 capsule 0   . hydrOXYzine (VISTARIL) 25 MG capsule Take 25 mg by mouth at bedtime as needed for anxiety.  1 unknown  . loratadine (CLARITIN) 10 MG tablet Take 1 tablet (10 mg total) by mouth daily. For allergies   05/24/2016 at Unknown time  . mirtazapine (REMERON) 15 MG tablet Take 1 tablet (15 mg total) by mouth at bedtime. For depression/sleep 30 tablet 0     Musculoskeletal: Strength & Muscle Tone:  within normal limits Gait & Station: normal Patient leans: N/A  Psychiatric Specialty Exam: Physical Exam  Constitutional: She is oriented to person, place, and time. She appears well-developed. No distress.  HENT:  Head: Normocephalic and atraumatic.  Eyes: Conjunctivae and EOM are normal. Pupils are equal, round, and reactive to light.  Neck: Normal range of motion. Neck supple.  Cardiovascular: Normal rate, regular rhythm and normal heart sounds.   Respiratory: Effort normal and breath sounds normal.  GI: Soft. Bowel sounds are normal.  Musculoskeletal: Normal range of motion.  Neurological: She is alert and oriented to person, place, and time. She has normal reflexes. She displays normal reflexes. No cranial nerve deficit. She exhibits normal muscle tone.  Skin: Skin is warm and dry.  Psychiatric:  As above  Review of Systems  Constitutional: Negative.  Negative for chills, fever and weight loss.  HENT: Negative.  Negative for hearing loss.   Eyes: Negative.   Respiratory: Negative.   Cardiovascular:       Orthostatic hypotension. Has been coping by drinking enough fluids.  Gastrointestinal: Negative.   Genitourinary: Negative.   Musculoskeletal: Negative.   Skin: Negative.  Negative for itching and rash.  Neurological: Negative.   Endo/Heme/Allergies: Negative.   Psychiatric/Behavioral: Positive for depression and suicidal ideas.    Blood pressure (!) 106/53, pulse 88, temperature 98.7 F (37.1 C), temperature source Oral, resp. rate 16, height 5' 3" (1.6 m), weight 62.6 kg (138 lb), last menstrual period 05/11/2016.Body mass index is 24.45 kg/m.  General Appearance: Casually dressed. she was at a group session prior to interview. at interview, she was withdrawn and took a while to warm up. she was tearful   Eye Contact:  Moderate  Speech:  Spontaneous, low tone  Volume:  Decreased  Mood:  Depressed  Affect:  Blunt and Congruent  Thought Process:  Goal Directed   Orientation:  Full (Time, Place, and Person)  Thought Content:  Ruminations about the negative things in her life. Hopelessness about the future. Still has feelings of worthlessness.   Suicidal Thoughts:  The thoughts are still at the back of her mind. She does not have any intent of acting here  Homicidal Thoughts:  No  Memory:  Normal recent and remote memory  Judgement:  Fair  Insight:  Good  Psychomotor Activity:  Psychomotor Retardation  Concentration:  Concentration: Fair  Recall:  Good  Fund of Knowledge:  Good  Language:  Good  Akathisia:  No  Handed:  Right  AIMS (if indicated):     Assets:  Desire for Improvement Financial Resources/Insurance Housing Physical Health Social Support  ADL's:  Intact  Cognition:  Alert. Within normal limits  Sleep:  Number of Hours: 6.25    Treatment Plan Summary: Daily contact with patient to assess and evaluate symptoms and progress in treatment and Medication management  Observation Level/Precautions:  15 minute checks  Laboratory:  TSH, EKG  Psychotherapy:    Medications:    Consultations:    Discharge Concerns:    Estimated LOS:  Other:     Physician Treatment Plan for Primary Diagnosis:   Patient has a unipolar depression. She is presenting with severe depression associated with suicidal thoughts and plans. Current episode is precipitated by her current psychosocial stressors. It is perpetualted by additional stress of living alone. I discussed addition of Wellbutrin to citalopram. We agreed to switch Brexiprixole to Aripiprizole . We discussed the risks and benefits of her current regimen in details. She consented to plan as outlined below.    1. Continue Citalopram at 40 mg daily 2. Hold Brexiprazole  3. Add Aripiprazole 5 mg daily 4. Add Bupropion SR 100 mg BID. Would gradually titrate towards antidepressant dose 5. Suicide precautions 6. Encourage patient to engage with unit groups and activities 7. Monitor mood,  behavior and interactions with peers 8. SW would obtain collateral from her family. We would explore access to weapons and any other concerns they have 9. Discharge planning would include out-patient psychotherapy 10. Check TSH and EKG  I certify that inpatient services furnished can reasonably be expected to improve the patient's condition.    [place]ist [place], MD 1/17/201811:06 AM

## 2016-05-27 NOTE — [facility] Group Notes (Signed)
[facility] LCSW Group Therapy 05/27/2016 1:15 PM  Type of Therapy: Group Therapy- Emotion Regulation  Participation Level: Reserved  Participation Quality:  Appropriate  Affect: Appropriate  Cognitive: Alert and Oriented   Insight:  Developing/Improving  Engagement in Therapy: Developing/Improving and Engaged   Modes of Intervention: Clarification, Confrontation, Discussion, Education, Exploration, Limit-setting, Orientation, Problem-solving, Rapport Building, Dance movement psychotherapisteality Testing, Socialization and Support  Summary of Progress/Problems: The topic for group today was emotional regulation. This group focused on both positive and negative emotion identification and allowed group members to process ways to identify feelings, regulate negative emotions, and find healthy ways to manage internal/external emotions. Group members were asked to reflect on a time when their reaction to an emotion led to a negative outcome and explored how alternative responses using emotion regulation would have benefited them. Group members were also asked to discuss a time when emotion regulation was utilized when a negative emotion was experienced. Pt expressed feeling abandoned due to her losing friends and support that she had in her community.  Pt was attentive to group discussion and was cooperative when prompted.    Vernie ShanksLauren Ariella Voit, LCSW 05/27/2016 10:11 AM

## 2016-05-27 NOTE — Tx Team (Signed)
Interdisciplinary Treatment and Diagnostic Plan Update  05/27/2016 Time of Session: 12:08 PM  Suzanne Hanson MRN: [id number]  Principal Diagnosis: MDD recurrent, severe without psychotic features  Secondary Diagnoses: Active Problems:   Major depressive disorder, recurrent severe without psychotic features ([place])   Current Medications:  Current Facility-Administered Medications  Medication Dose Route Frequency Provider Last Rate Last Dose  . acetaminophen (TYLENOL) tablet 650 mg  650 mg Oral Q4H PRN Patrecia Pour, NP      . alum & mag hydroxide-simeth (MAALOX/MYLANTA) 200-200-20 MG/5ML suspension 30 mL  30 mL Oral Q4H PRN Patrecia Pour, NP      . citalopram (CELEXA) tablet 40 mg  40 mg Oral QPM Patrecia Pour, NP   40 mg at 05/26/16 1819  . hydrOXYzine (ATARAX/VISTARIL) tablet 25 mg  25 mg Oral QHS PRN Patrecia Pour, NP      . loratadine (CLARITIN) tablet 10 mg  10 mg Oral Daily Patrecia Pour, NP   10 mg at 05/27/16 0806  . magnesium hydroxide (MILK OF MAGNESIA) suspension 30 mL  30 mL Oral Daily PRN Patrecia Pour, NP      . mirtazapine (REMERON) tablet 15 mg  15 mg Oral QHS Patrecia Pour, NP   15 mg at 05/26/16 2215  . ondansetron (ZOFRAN) tablet 4 mg  4 mg Oral Q8H PRN Patrecia Pour, NP        PTA Medications: Prescriptions Prior to Admission  Medication Sig Dispense Refill Last Dose  . Brexpiprazole (REXULTI) 2 MG TABS Take 2 mg by mouth daily.   05/24/2016 at Unknown time  . Brexpiprazole (REXULTI) 2 MG TABS Take 2 mg by mouth at bedtime.   05/24/2016 at Unknown time  . citalopram (CELEXA) 20 MG tablet Take 40 mg by mouth every evening.   0 05/24/2016 at Unknown time  . citalopram (CELEXA) 40 MG tablet Take 40 mg by mouth daily.   Not Taking at Unknown time  . DULoxetine (CYMBALTA) 60 MG capsule Take 1 capsule (60 mg total) by mouth 2 (two) times daily. For depression 60 capsule 0   . hydrOXYzine (VISTARIL) 25 MG capsule Take 25 mg by mouth at bedtime as needed for anxiety.   1 unknown  . loratadine (CLARITIN) 10 MG tablet Take 1 tablet (10 mg total) by mouth daily. For allergies   05/24/2016 at Unknown time  . mirtazapine (REMERON) 15 MG tablet Take 1 tablet (15 mg total) by mouth at bedtime. For depression/sleep 30 tablet 0     Treatment Modalities: Medication Management, Group therapy, Case management,  1 to 1 session with clinician, Psychoeducation, Recreational therapy.  Patient Stressors: Loss of wife (Separation) Marital or family conflict Occupational concerns  Patient Strengths: Ability for insight Agricultural engineer for treatment/growth Physical Health Supportive family/friends  Physician Treatment Plan for Primary Diagnosis: MDD recurrent, severe without psychotic features Long Term Goal(s): Improvement in symptoms so as ready for discharge  Short Term Goals:  None selected by provider  Medication Management: Evaluate patient's response, side effects, and tolerance of medication regimen.  Therapeutic Interventions: 1 to 1 sessions, Unit Group sessions and Medication administration.  Evaluation of Outcomes: Not Met  Physician Treatment Plan for Secondary Diagnosis: Active Problems:   Major depressive disorder, recurrent severe without psychotic features ([place])   Long Term Goal(s): Improvement in symptoms so as ready for discharge  Short Term Goals:  None selected by provider  Medication Management: Evaluate patient's response, side effects, and tolerance of  medication regimen.  Therapeutic Interventions: 1 to 1 sessions, Unit Group sessions and Medication administration.  Evaluation of Outcomes: Not Met   RN Treatment Plan for Primary Diagnosis: MDD recurrent, severe without psychotic features Long Term Goal(s): Knowledge of disease and therapeutic regimen to maintain health will improve  Short Term Goals: Ability to verbalize feelings will improve, Ability to disclose and discuss suicidal ideas and Ability to identify  and develop effective coping behaviors will improve  Medication Management: RN will administer medications as ordered by provider, will assess and evaluate patient's response and provide education to patient for prescribed medication. RN will report any adverse and/or side effects to prescribing provider.  Therapeutic Interventions: 1 on 1 counseling sessions, Psychoeducation, Medication administration, Evaluate responses to treatment, Monitor vital signs and CBGs as ordered, Perform/monitor CIWA, COWS, AIMS and Fall Risk screenings as ordered, Perform wound care treatments as ordered.  Evaluation of Outcomes: Not Met   LCSW Treatment Plan for Primary Diagnosis: MDD recurrent, severe without psychotic features Long Term Goal(s): Safe transition to appropriate next level of care at discharge, Engage patient in therapeutic group addressing interpersonal concerns.  Short Term Goals: Engage patient in aftercare planning with referrals and resources, Identify triggers associated with mental health/substance abuse issues and Increase skills for wellness and recovery  Therapeutic Interventions: Assess for all discharge needs, 1 to 1 time with Social worker, Explore available resources and support systems, Assess for adequacy in community support network, Educate family and significant other(s) on suicide prevention, Complete Psychosocial Assessment, Interpersonal group therapy.  Evaluation of Outcomes: Not Met   Progress in Treatment: Attending groups: Pt is new to milieu, continuing to assess  Participating in groups: Pt is new to milieu, continuing to assess  Taking medication as prescribed: Yes, MD continues to assess for medication changes as needed Toleration medication: Yes, no side effects reported at this time Family/Significant other contact made: No, CSW attempting to make contact with mother Patient understands diagnosis: Continuing to assess Discussing patient identified problems/goals  with staff: Yes Medical problems stabilized or resolved: Yes Denies suicidal/homicidal ideation: Yes Issues/concerns per patient self-inventory: None Other: N/A  New problem(s) identified: None identified at this time.   New Short Term/Long Term Goal(s): None identified at this time.   Discharge Plan or Barriers: Pt will return home and follow-up with outpatient services.   Reason for Continuation of Hospitalization: Anxiety Depression Medication stabilization Suicidal ideation  Estimated Length of Stay: 3-5 days  Attendees: Patient: 05/27/2016  12:08 PM  Physician: Dr. Parke Poisson; Dr. Sanjuana Letters 05/27/2016  12:08 PM  Nursing: Desma Paganini, RN 05/27/2016  12:08 PM  RN Care Manager: Lars Pinks, RN 05/27/2016  12:08 PM  Social Worker: Adriana Reams, LCSW; [place], LCSW 05/27/2016  12:08 PM  Recreational Therapist:  05/27/2016  12:08 PM  Other: Lindell Spar, NP 05/27/2016  12:08 PM  Other:  05/27/2016  12:08 PM  Other: 05/27/2016  12:08 PM    Scribe for Treatment Team: Gladstone Lighter, LCSW 05/27/2016 12:08 PM

## 2016-05-27 NOTE — [facility] Suicide Risk Assessment (Signed)
[facility] Admission Suicide Risk Assessment   Nursing information obtained from:  Patient Demographic factors:  Gay, lesbian, or bisexual orientation, Living alone Current Mental Status:  Suicidal ideation indicated by patient, Suicide plan, Self-harm thoughts, Self-harm behaviors Loss Factors:  Loss of significant relationship Historical Factors:  Family history of mental illness or substance abuse Risk Reduction Factors:  Positive social support  Total Time spent with patient: 45 minutes Principal Problem: <principal problem not specified> Diagnosis:   Patient Active Problem List   Diagnosis Date Noted  . Major depressive disorder, recurrent severe without psychotic features (HCC) [F33.2] 05/26/2016  . MDD (major depressive disorder), recurrent severe, without psychosis (HCC) [F33.2] 10/05/2015   Subjective Data:  As in HPI  Continued Clinical Symptoms:  Alcohol Use Disorder Identification Test Final Score (AUDIT): 1 The "Alcohol Use Disorders Identification Test", Guidelines for Use in Primary Care, Second Edition.  World Science writer [facility]). Score between 0-7:  no or low risk or alcohol related problems. Score between 8-15:  moderate risk of alcohol related problems. Score between 16-19:  high risk of alcohol related problems. Score 20 or above:  warrants further diagnostic evaluation for alcohol dependence and treatment.   CLINICAL FACTORS:  Worsening depression with marked psychomotor retardation and apathy.  Recurrent thoughts of suicide. Has been there for over two months and is getting worse. Has been thinking about various ways of doing it. Unresolved psychosocial stressors Narrowing of her support structures No current psychological support in the community Financial constraints associated with separation   Musculoskeletal: Strength & Muscle Tone: within normal limits Gait & Station: normal Patient leans: N/A  Psychiatric Specialty Exam: Physical Exam   Constitutional: She is oriented to person, place, and time. She appears well-developed and well-nourished.  HENT:  Head: Normocephalic and atraumatic.  Eyes: Conjunctivae are normal. Pupils are equal, round, and reactive to light.  Neck: Normal range of motion. Neck supple.  Cardiovascular: Normal rate, regular rhythm and normal heart sounds.   Respiratory: Effort normal and breath sounds normal.  GI: Soft. Bowel sounds are normal.  Musculoskeletal: Normal range of motion.  Neurological: She is alert and oriented to person, place, and time. She has normal reflexes.  Skin: Skin is warm and dry.  Psychiatric:  As in HPI    Review of Systems  Constitutional: Negative.   HENT: Negative.   Eyes: Negative.   Cardiovascular:       Orthostatic hypotension  Skin: Negative.     Blood pressure (!) 106/53, pulse 88, temperature 98.7 F (37.1 C), temperature source Oral, resp. rate 16, height 5\' 3"  (1.6 m), weight 62.6 kg (138 lb), last menstrual period 05/11/2016.Body mass index is 24.45 kg/m.   See H&P for MSE                                                        COGNITIVE FEATURES THAT CONTRIBUTE TO RISK:  None    SUICIDE RISK:   Severe:  Frequent, intense, and enduring suicidal ideation, specific plan, no subjective intent, but some objective markers of intent (i.e., choice of lethal method), the method is accessible, some limited preparatory behavior, evidence of impaired self-control, severe dysphoria/symptomatology, multiple risk factors present, and few if any protective factors, particularly a lack of social support.   PLAN OF CARE:  1. Continue Citalopram at 40  mg daily 2. Hold Brexiprazole  3. Add Aripiprazole 5 mg daily 4. Add Bupropion SR 100 mg BID. Would gradually titrate towards antidepressant dose 5. Suicide precautions 6. Encourage patient to engage with unit groups and activities 7. Monitor mood, behavior and interactions with peers 8.  SW would obtain collateral from her family. We would explore access to weapons and any other concerns they have 9. Discharge planning would include out-patient psychotherapy 10. Check TSH and EKG  I certify that inpatient services furnished can reasonably be expected to improve the patient's condition.  Georgiann CockerVincent A Ajahni Nay, MD 05/27/2016, 12:13 PM

## 2016-05-28 DIAGNOSIS — Z8261 Family history of arthritis: Secondary | ICD-10-CM

## 2016-05-28 DIAGNOSIS — Z808 Family history of malignant neoplasm of other organs or systems: Secondary | ICD-10-CM

## 2016-05-28 DIAGNOSIS — Z833 Family history of diabetes mellitus: Secondary | ICD-10-CM

## 2016-05-28 DIAGNOSIS — Z9889 Other specified postprocedural states: Secondary | ICD-10-CM

## 2016-05-28 DIAGNOSIS — Z813 Family history of other psychoactive substance abuse and dependence: Secondary | ICD-10-CM

## 2016-05-28 DIAGNOSIS — Z818 Family history of other mental and behavioral disorders: Secondary | ICD-10-CM

## 2016-05-28 DIAGNOSIS — Z79899 Other long term (current) drug therapy: Secondary | ICD-10-CM

## 2016-05-28 DIAGNOSIS — Z8262 Family history of osteoporosis: Secondary | ICD-10-CM

## 2016-05-28 DIAGNOSIS — F332 Major depressive disorder, recurrent severe without psychotic features: Principal | ICD-10-CM

## 2016-05-28 MED ORDER — BUPROPION HCL ER (SR) 150 MG PO TB12
150.0000 mg | ORAL_TABLET | Freq: Two times a day (BID) | ORAL | Status: DC
  Administered 2016-05-28 – 2016-05-31 (×6): 150 mg via ORAL
  Filled 2016-05-28 (×10): qty 1

## 2016-05-28 NOTE — Progress Notes (Signed)
[facility] [address] MD Progress Note  05/28/2016 1:21 PM Suzanne Hanson  MRN:  [id number]   Subjective:  Patient is tearful once we spoke about her children and her separation since June last year. Objective:  Suzanne Hanson is admitted for major depression.  She is currently separated from her husband and she has 2 young children.  Patient was tearful but did manage to smile during conversation.  She denies suicidal ideations.  Still sad and low mood.  States that she is hopeful with med changes and adjustment.  Patient did c/o slight dizziness, not persistent earlier today.      Principal Problem: Major depressive disorder, recurrent severe without psychotic features (HCC) Diagnosis:   Patient Active Problem List   Diagnosis Date Noted  . Major depressive disorder, recurrent severe without psychotic features (HCC) [F33.2] 05/26/2016    Priority: High  . MDD (major depressive disorder), recurrent severe, without psychosis (HCC) [F33.2] 10/05/2015   Total Time spent with patient: 30 minutes  Past Psychiatric History: see HPI  Past Medical History:  Past Medical History:  Diagnosis Date  . Allergy   . Anxiety   . Depression   . Prediabetes     Past Surgical History:  Procedure Laterality Date  . BUNIONECTOMY     Family History:  Family History  Problem Relation Age of Onset  . Osteoporosis Mother   . Post-traumatic stress disorder Father   . Drug abuse Sister   . Epilepsy Brother   . Food Allergy Son   . Arthritis Maternal Grandmother   . Osteoporosis Maternal Grandmother   . Graves' disease Maternal Grandfather   . Cancer Maternal Grandfather   . Diabetes Paternal Grandfather   . Cancer Paternal Grandfather    Family Psychiatric  History:  See HPI Social History:  History  Alcohol Use No     History  Drug Use No    Social History   Social History  . Marital status: Single    Spouse name: N/A  . Number of children: N/A  . Years of education: N/A   Social History Main Topics   . Smoking status: Never Smoker  . Smokeless tobacco: Never Used  . Alcohol use No  . Drug use: No  . Sexual activity: Yes    Partners: Female     Comment: same sex partner   Other Topics Concern  . None   Social History Narrative  . None   Additional Social History:   Sleep: Good  Appetite:  Good  Current Medications: Current Facility-Administered Medications  Medication Dose Route Frequency Provider Last Rate Last Dose  . acetaminophen (TYLENOL) tablet 650 mg  650 mg Oral Q4H PRN Charm Rings, NP      . alum & mag hydroxide-simeth (MAALOX/MYLANTA) 200-200-20 MG/5ML suspension 30 mL  30 mL Oral Q4H PRN Charm Rings, NP      . ARIPiprazole (ABILIFY) tablet 5 mg  5 mg Oral Daily Georgiann Cocker, MD   5 mg at 05/28/16 0804  . buPROPion (WELLBUTRIN SR) 12 hr tablet 100 mg  100 mg Oral BID Georgiann Cocker, MD   100 mg at 05/28/16 0804  . citalopram (CELEXA) tablet 40 mg  40 mg Oral QPM Charm Rings, NP   40 mg at 05/27/16 1814  . hydrOXYzine (ATARAX/VISTARIL) tablet 25 mg  25 mg Oral QHS PRN Charm Rings, NP      . loratadine (CLARITIN) tablet 10 mg  10 mg Oral Daily Charm Rings, NP  10 mg at 05/28/16 0804  . magnesium hydroxide (MILK OF MAGNESIA) suspension 30 mL  30 mL Oral Daily PRN Charm Rings, NP      . mirtazapine (REMERON) tablet 15 mg  15 mg Oral QHS Charm Rings, NP   15 mg at 05/27/16 2140  . ondansetron (ZOFRAN) tablet 4 mg  4 mg Oral Q8H PRN Charm Rings, NP        Lab Results:  Results for orders placed or performed during the hospital encounter of 05/26/16 (from the past 48 hour(s))  Pregnancy, urine     Status: None   Collection Time: 05/27/16 10:50 AM  Result Value Ref Range   Preg Test, Ur NEGATIVE NEGATIVE    Comment:        THE SENSITIVITY OF THIS METHODOLOGY IS >20 mIU/mL. Performed at [facility], [address] [place], [place] [address]   Urinalysis, Routine w reflex microscopic     Status: Abnormal    Collection Time: 05/27/16 10:50 AM  Result Value Ref Range   Color, Urine STRAW (A) YELLOW   APPearance CLEAR CLEAR   Specific Gravity, Urine 1.006 1.005 - 1.030   pH 6.0 5.0 - 8.0   Glucose, UA NEGATIVE NEGATIVE mg/dL   Hgb urine dipstick NEGATIVE NEGATIVE   Bilirubin Urine NEGATIVE NEGATIVE   Ketones, ur NEGATIVE NEGATIVE mg/dL   Protein, ur NEGATIVE NEGATIVE mg/dL   Nitrite NEGATIVE NEGATIVE   Leukocytes, UA TRACE (A) NEGATIVE   RBC / HPF 0-5 0 - 5 RBC/hpf   WBC, UA 0-5 0 - 5 WBC/hpf   Bacteria, UA RARE (A) NONE SEEN   Squamous Epithelial / LPF 0-5 (A) NONE SEEN    Comment: Performed at [facility], [address] [place], [place] [address]  TSH     Status: None   Collection Time: 05/27/16  6:15 PM  Result Value Ref Range   TSH 0.498 0.350 - 4.500 uIU/mL    Comment: Performed by a 3rd Generation assay with a functional sensitivity of <=0.01 uIU/mL. Performed at [facility], [address], [place], [place] [address]     Blood Alcohol level:  Lab Results  Component Value Date   ETH <5 05/25/2016   ETH <5 10/05/2015    Metabolic Disorder Labs: No results found for: HGBA1C, MPG No results found for: PROLACTIN No results found for: CHOL, TRIG, HDL, CHOLHDL, VLDL, LDLCALC  Physical Findings: AIMS: Facial and Oral Movements Muscles of Facial Expression: None, normal Lips and Perioral Area: None, normal Jaw: None, normal Tongue: None, normal,Extremity Movements Upper (arms, wrists, hands, fingers): None, normal Lower (legs, knees, ankles, toes): None, normal, Trunk Movements Neck, shoulders, hips: None, normal, Overall Severity Severity of abnormal movements (highest score from questions above): None, normal Incapacitation due to abnormal movements: None, normal Patient's awareness of abnormal movements (rate only patient's report): No Awareness, Dental Status Current problems with teeth and/or dentures?: No Does patient usually  wear dentures?: No  CIWA:    COWS:     Musculoskeletal: Strength & Muscle Tone: within normal limits Gait & Station: normal Patient leans: N/A  Psychiatric Specialty Exam: Physical Exam  Nursing note and vitals reviewed.   ROS  Blood pressure (!) 96/59, pulse 75, temperature 98.6 F (37 C), resp. rate 16, height 5\' 3"  (1.6 m), weight 62.6 kg (138 lb), last menstrual period 05/11/2016.Body mass index is 24.45 kg/m.  General Appearance: Casual  Eye Contact:  Good  Speech:  Normal Rate  Volume:  Normal  Mood:  Depressed  Affect:  Tearful  Thought Process:  Coherent  Orientation:  Full (Time, Place, and Person)  Thought Content:  Rumination  Suicidal Thoughts:  No  denies  Homicidal Thoughts:  No  Memory:  Immediate;   Good Recent;   Good Remote;   Good  Judgement:  Good  Insight:  Good  Psychomotor Activity:  Normal  Concentration:  Concentration: Good and Attention Span: Good  Recall:  Good  Fund of Knowledge:  Good  Language:  Good  Akathisia:  No  Handed:  Right  AIMS (if indicated):     Assets:  Communication Skills Desire for Improvement Resilience  ADL's:  Intact  Cognition:  WNL  Sleep:  Number of Hours: 6.5   Treatment Plan Summary: Review of chart, vital signs, medications, and notes.  1-Individual and group therapy  2-Medication management for depression and anxiety: Medications reviewed with the patient.   -Continue Citalopram at 40 mg daily -Hold Brexiprazole  -Cont Aripiprazole 5 mg daily -Increased Bupropion SR to  150 mg BID 3-Coping skills for depression, anxiety  4-Continue crisis stabilization and management  5-Address health issues--monitoring vital signs, stable  6-Treatment plan in progress to prevent relapse of depression and anxiety 7- Lab work TSH 0.498 and EKG Normal ECG  Velna HatchetSheila May Zenna Traister, NP [facility] 05/28/2016, 1:21 PM

## 2016-05-28 NOTE — Progress Notes (Signed)
D: Patient denies SI/HI and A/V hallucinations;   A: Monitored q 15 minutes; patient encouraged to attend groups; patient educated about medications; patient given medications per physician orders; patient encouraged to express feelings and/or concerns  R: Patient rested most of the day; patient is quiet but cooperative; patient's interaction with staff and peers is minimal; patient has mostly been observed reading when while in the dayroom instead of interacting; patient was able to set goal to talk with staff 1:1 when having feelings of SI; patient is taking medications as prescribed and tolerating medications; patient is attending all groups

## 2016-05-28 NOTE — [facility] Group Notes (Signed)
[facility] LCSW Group Therapy 05/28/2016 1:15pm  Type of Therapy: Group Therapy- Balance in Life  Participation Level: Reserved  Description of the Group:  The topic for group was balance in life. Today's group focused on defining balance in one's own words, identifying things that can knock one off balance, and exploring healthy ways to maintain balance in life. Group members were asked to provide an example of a time when they felt off balance, describe how they handled that situation,and process healthier ways to regain balance in the future. Group members were asked to share the most important tool for maintaining balance that [facility] and how they plan to apply this method after discharge.  Summary of Patient Progress Pt was observed to be tearful in discussion. She expressed that feeling lonely was contributing to her perception that her life was unbalanced. Pt shared that her best friends had chosen not to respond to her attempts at communication after Pt separated from her wife.     Therapeutic Modalities:   Cognitive Behavioral Therapy Solution-Focused Therapy Assertiveness Training   Vernie ShanksLauren Mayo Owczarzak, LCSW 05/28/2016 4:15 PM

## 2016-05-28 NOTE — [facility] Suicide Risk Assessment (Signed)
[facility] INPATIENT:  Family/Significant Other Suicide Prevention Education  Suicide Prevention Education:  Education Completed; Merilynn FinlandLeslie Dunn, Pt's mother [phone], has been identified by the patient as the family member/significant other with whom the patient will be residing, and identified as the person(s) who will aid the patient in the event of a mental health crisis (suicidal ideations/suicide attempt).  With written consent from the patient, the family member/significant other has been provided the following suicide prevention education, prior to the and/or following the discharge of the patient.  The suicide prevention education provided includes the following:  Suicide risk factors  Suicide prevention and interventions  National Suicide Hotline telephone number  [facility] assessment telephone number  [facility] Assistance 911  [facility] and/or [facility]idential Mobile Crisis Unit telephone number  Request made of family/significant other to:  Remove weapons (e.g., guns, rifles, knives), all items previously/currently identified as safety concern.    Remove drugs/medications (over-the-counter, prescriptions, illicit drugs), all items previously/currently identified as a safety concern.  The family member/significant other verbalizes understanding of the suicide prevention education information provided.  The family member/significant other agrees to remove the items of safety concern listed above.  Verdene LennertLauren C Raelle Chambers 05/28/2016, 12:49 PM

## 2016-05-28 NOTE — Progress Notes (Signed)
D: Pt is alert and oriented x 4. Pt at the time of assessment endorses moderate depression; states, "this has been my major problem; we will see when I start the new antidepressant." Pt denied anxiety, SI, HI or AVH. Pt is flat however; remained calm and cooperative. A: Medications offered as prescribed.  Support, encouragement, and safe environment provided.  15-minute safety checks continue. R: Pt was med compliant.  Pt attended group. Safety checks continue

## 2016-05-28 NOTE — Progress Notes (Signed)
D   Pt is pleasant and cooperative   She reports attending group and enjoying it   She smiles and is overall feeling better    She denies suicidal ideation at present A    Verbal support given   Medications administered and effectiveness monitored    Q 15 min checks R   Pt is safe at present and receptive to verbal support

## 2016-05-29 NOTE — Progress Notes (Signed)
Adult Psychoeducational Group Note  Date:  05/29/2016 Time:  10:36 AM  Group Topic/Focus:  Healthy Communication:   The focus of this group is to discuss communication, barriers to communication, as well as healthy ways to communicate with others.  Participation Level:  Active  Participation Quality:  Appropriate  Affect:  Appropriate  Cognitive:  Alert and Appropriate  Insight: Appropriate  Engagement in Group:  Engaged  Modes of Intervention:  Discussion  Additional Comments:  Pt attended group this morning, Pt states that she came here with depression but due to the medication, she is feeling a lot better.  Kienan Doublin R Khalis Hittle 05/29/2016, 10:36 AM

## 2016-05-29 NOTE — Progress Notes (Signed)
[name] is seen OOB UAL on the [address] hall today...she tolerates this well. A She completes her daily assessment and on it she wrote she denied SI today and she rated her depression , hopelessness and anxiety " 5/5/0", respectviely. She has spent much of her day in the 400 hall dayroom , watching TV and visiting with her peers. She is settling into the unit milieu well. R Safety in place

## 2016-05-29 NOTE — [facility] Group Notes (Signed)
[facility] LCSW Group Therapy 05/29/2016 1:15pm  Type of Therapy: Group Therapy- Feelings Around Relapse and Recovery  Participation Level: Reserved  Participation Quality:  Appropriate  Affect:  Appropriate  Cognitive: Alert and Oriented   Insight:  Developing   Engagement in Therapy: Developing/Improving and Engaged   Modes of Intervention: Clarification, Confrontation, Discussion, Education, Exploration, Limit-setting, Orientation, Problem-solving, Rapport Building, Reality Testing, Socialization and Support  Summary of Progress/Problems: The topic for today was feelings about relapse. The group discussed what relapse prevention is to them and identified triggers that they are on the path to relapse. Members also processed their feeling towards relapse and were able to relate to common experiences. Group also discussed coping skills that can be used for relapse prevention.     Therapeutic Modalities:   Cognitive Behavioral Therapy Solution-Focused Therapy Assertiveness Training Relapse Prevention Therapy    Treyson Axel, LCSW [phone] 05/29/2016 5:25 PM  

## 2016-05-29 NOTE — Progress Notes (Signed)
Adult Psychoeducational Group Note  Date:  05/29/2016 Time:  9:04 PM  Group Topic/Focus:  Wrap-Up Group:   The focus of this group is to help patients review their daily goal of treatment and discuss progress on daily workbooks.  Participation Level:  Minimal  Participation Quality:  Appropriate  Affect:  Flat  Cognitive:  Alert  Insight: Appropriate  Engagement in Group:  Lacking  Modes of Intervention:  Discussion  Additional Comments:  Pt stated that she had an ok day. Her goal was not to nap all day and she didn't.  Kaleen OdeaCOOKE, Richie Vadala R 05/29/2016, 9:04 PM

## 2016-05-29 NOTE — Progress Notes (Signed)
[facility]  05/29/2016 3:47 PM Suzanne Hanson  MRN:  [id number]   Subjective:  Patient remains tearful when she thinks of the emotional stress that she has placed upon her children. Objective:  Suzanne BernQuirina is admitted for major depression.  She is currently separated from her partner/wife.   and she has 3 young children.   Patient was tearful but did manage to smile during conversation.  She denies suicidal ideations.  Still sad and low mood but attempting to goal set with using fitness as a coping mechanism.  Patient used to run and cycle.  States that she is hopeful with med changes and adjustment.     Principal Problem: Major depressive disorder, recurrent severe without psychotic features (HCC) Diagnosis:   Patient Active Problem List   Diagnosis Date Noted  . Major depressive disorder, recurrent severe without psychotic features (HCC) [F33.2] 05/26/2016    Priority: High  . MDD (major depressive disorder), recurrent severe, without psychosis (HCC) [F33.2] 10/05/2015   Total Time spent with patient: 30 minutes  Past Psychiatric History: see HPI  Past Medical History:  Past Medical History:  Diagnosis Date  . Allergy   . Anxiety   . Depression   . Prediabetes     Past Surgical History:  Procedure Laterality Date  . BUNIONECTOMY     Family History:  Family History  Problem Relation Age of Onset  . Osteoporosis Mother   . Post-traumatic stress disorder Father   . Drug abuse Sister   . Epilepsy Brother   . Food Allergy Son   . Arthritis Maternal Grandmother   . Osteoporosis Maternal Grandmother   . Graves' disease Maternal Grandfather   . Cancer Maternal Grandfather   . Diabetes Paternal Grandfather   . Cancer Paternal Grandfather    Family Psychiatric  History:  See HPI Social History:  History  Alcohol Use No     History  Drug Use No    Social History   Social History  . Marital status: Single    Spouse name: N/A  . Number of children: N/A  .  Years of education: N/A   Social History Main Topics  . Smoking status: Never Smoker  . Smokeless tobacco: Never Used  . Alcohol use No  . Drug use: No  . Sexual activity: Yes    Partners: Female     Comment: same sex partner   Other Topics Concern  . None   Social History Narrative  . None   Additional Social History:   Sleep: Good  Appetite:  Good  Current Medications: Current Facility-Administered Medications  Medication Dose Route Frequency Provider Last Rate Last Dose  . acetaminophen (TYLENOL) tablet 650 mg  650 mg Oral Q4H PRN Charm RingsJamison Y Lord, NP      . alum & mag hydroxide-simeth (MAALOX/MYLANTA) 200-200-20 MG/5ML suspension 30 mL  30 mL Oral Q4H PRN Charm RingsJamison Y Lord, NP      . ARIPiprazole (ABILIFY) tablet 5 mg  5 mg Oral Daily Georgiann CockerVincent A Izediuno, MD   5 mg at 05/29/16 0829  . buPROPion [facility](WELLBUTRIN SR) 12 hr tablet 150 mg  150 mg Oral BID Adonis BrookSheila Lilygrace Rodick, NP   150 mg at 05/29/16 0829  . citalopram (CELEXA) tablet 40 mg  40 mg Oral QPM Charm RingsJamison Y Lord, NP   40 mg at 05/28/16 1655  . hydrOXYzine (ATARAX/VISTARIL) tablet 25 mg  25 mg Oral QHS PRN Charm RingsJamison Y Lord, NP      . loratadine (CLARITIN) tablet  10 mg  10 mg Oral Daily Charm Rings, NP   10 mg at 05/29/16 0829  . magnesium hydroxide (MILK OF MAGNESIA) suspension 30 mL  30 mL Oral Daily PRN Charm Rings, NP      . mirtazapine (REMERON) tablet 15 mg  15 mg Oral QHS Charm Rings, NP   15 mg at 05/28/16 2155  . ondansetron (ZOFRAN) tablet 4 mg  4 mg Oral Q8H PRN Charm Rings, NP        Lab Results:  Results for orders placed or performed during the hospital encounter of 05/26/16 (from the past 48 hour(s))  TSH     Status: None   Collection Time: 05/27/16  6:15 PM  Result Value Ref Range   TSH 0.498 0.350 - 4.500 uIU/mL    Comment: Performed by a 3rd Generation assay with a functional sensitivity of <=0.01 uIU/mL. Performed at [facility], [address], [place], [place] [address]      Blood Alcohol level:  Lab Results  Component Value Date   ETH <5 05/25/2016   ETH <5 10/05/2015    Metabolic Disorder Labs: No results found for: HGBA1C, MPG No results found for: PROLACTIN No results found for: CHOL, TRIG, HDL, CHOLHDL, VLDL, LDLCALC  Physical Findings: AIMS: Facial and Oral Movements Muscles of Facial Expression: None, normal Lips and Perioral Area: None, normal Jaw: None, normal Tongue: None, normal,Extremity Movements Upper (arms, wrists, hands, fingers): None, normal Lower (legs, knees, ankles, toes): None, normal, Trunk Movements Neck, shoulders, hips: None, normal, Overall Severity Severity of abnormal movements (highest score from questions above): None, normal Incapacitation due to abnormal movements: None, normal Patient's awareness of abnormal movements (rate only patient's report): No Awareness, Dental Status Current problems with teeth and/or dentures?: No Does patient usually wear dentures?: No  CIWA:    COWS:     Musculoskeletal: Strength & Muscle Tone: within normal limits Gait & Station: normal Patient leans: N/A  Psychiatric Specialty Exam: Physical Exam  Nursing note and vitals reviewed.   ROS  Blood pressure 108/62, pulse 74, temperature 97.9 F (36.6 C), temperature source Oral, resp. rate 16, height 5\' 3"  (1.6 m), weight 62.6 kg (138 lb), last menstrual period 05/11/2016.Body mass index is 24.45 kg/m.  General Appearance: Casual  Eye Contact:  Good  Speech:  Normal Rate  Volume:  Normal  Mood:  Depressed  Affect:  Tearful  Thought Process:  Coherent  Orientation:  Full (Time, Place, and Person)  Thought Content:  Rumination  Suicidal Thoughts:  No  denies  Homicidal Thoughts:  No  Memory:  Immediate;   Good Recent;   Good Remote;   Good  Judgement:  Good  Insight:  Good  Psychomotor Activity:  Normal  Concentration:  Concentration: Good and Attention Span: Good  Recall:  Good  Fund of Knowledge:  Good   Language:  Good  Akathisia:  No  Handed:  Right  AIMS (if indicated):     Assets:  Communication Skills Desire for Improvement Resilience  ADL's:  Intact  Cognition:  WNL  Sleep:  Number of Hours: 6.75   Treatment Plan Summary: Review of chart, vital signs, medications, and notes.  1-Individual and group therapy  2-Medication management for depression and anxiety: Medications reviewed with the patient.   -Continue Citalopram at 40 mg daily -Cont Aripiprazole 5 mg daily -Increased Bupropion SR to  150 mg BID 3-Coping skills for depression, anxiety  4-Continue crisis stabilization and management  5-Address health issues--monitoring vital signs, stable  6-Treatment plan in progress to prevent relapse of depression and anxiety 7- Lab work TSH 0.498 and EKG Normal ECG  Lindwood Qua, NP [facility] 05/29/2016, 3:47 PM

## 2016-05-29 NOTE — Progress Notes (Signed)
D   Pt is pleasant and cooperative   She reports attending group and enjoying it   She smiles and is overall feeling better    She denies suicidal ideation at present   She requested to take some medications that she had initially refused around mealtime A    Verbal support given   Medications administered and effectiveness monitored    Q 15 min checks R   Pt is safe at present and receptive to verbal support

## 2016-05-29 NOTE — Plan of Care (Signed)
Problem: Activity: Goal: Imbalance in normal sleep/wake cycle will improve Outcome: Progressing Sleep has improved  Problem: Role Relationship: Goal: Ability to demonstrate positive changes in social behaviors and relationships will improve Outcome: Progressing Pt demonstrating positive interactions with others  Problem: Self-Concept: Goal: Ability to verbalize positive feelings about self will improve Outcome: Progressing Making more positive remarks Goal: Level of anxiety will decrease Outcome: Progressing Pt reports less anxious

## 2016-05-30 NOTE — [facility] Group Notes (Signed)
Adult Therapy Group Note  Date:  05/30/2016 Time: 2:15-3:05pm  Group Topic/Focus:  Discussion of healthy versus unhealthy coping techniques, and what specific healthy coping techniques patients would like to learn.  Examples given, reasons discussed, and healthy group conversation was held.  Healthy coping skills desired include:  Not holding in feelings to the point of exploding, meditation, learning how to handle social situations, positive self-talk replacing the negative, communication skills, talking about things, not letting small things build up and bother them.  Participation Level:  Active  Participation Quality:  Attentive and Sharing  Affect:  Appropriate  Cognitive:  Appropriate  Insight: Good  Engagement in Group:  Engaged  Modes of Intervention:  Discussion and Exploration  Additional Comments:  Pt stated she wants to learn more about meditation skills, but she later said the biggest unhealthy skill she currently uses is negative self-talk, so she really was focused on that throughout our conversation.  Suzanne Hanson 05/30/2016, 4:30 PM

## 2016-05-30 NOTE — Plan of Care (Signed)
Problem: Medication: Goal: Compliance with prescribed medication regimen will improve Outcome: Progressing Pt is compliant with medications  Problem: Self-Concept: Goal: Ability to verbalize positive feelings about self will improve Outcome: Progressing Making positive statements

## 2016-05-30 NOTE — Plan of Care (Signed)
Problem: Activity: Goal: Interest or engagement in leisure activities will improve Outcome: Progressing Patient has attended every group activity this shift.   

## 2016-05-30 NOTE — Progress Notes (Signed)
D   Pt is pleasant on approach and cooperative    She complained of a bubbly and gas stomach   Pt interacts well with her peers and is a group participant and compliant with treatment    She does endorse mild depression and anxiety  A    Verbal support given   Medications administered and effectiveness monitored    Q 15 min checks   Administered maalox for gas R   Pt is safe and receptive to verbal support and said the maalox does help her

## 2016-05-30 NOTE — Progress Notes (Signed)
[facility]  05/30/2016 1:24 PM Suzanne Hanson  MRN:  [id number]   Subjective:  Patient appears brighter.  States that she feels improvement.  Reports talking to her mother and the conversations went well.  She did c/o excess sweating under arms.    Objective:  Suzanne Hanson is admitted for major depression.  She is currently separated from her partner/wife.   and she has 3 young children.   Patient was tearful but did manage to smile during conversation.  She denies suicidal ideations.  Still sad and low mood but attempting to goal set with using fitness as a coping mechanism.  Patient used to run and cycle.  States that she is hopeful with med changes and adjustment.     Principal Problem: Major depressive disorder, recurrent severe without psychotic features (HCC) Diagnosis:   Patient Active Problem List   Diagnosis Date Noted  . Major depressive disorder, recurrent severe without psychotic features (HCC) [F33.2] 05/26/2016    Priority: High  . MDD (major depressive disorder), recurrent severe, without psychosis (HCC) [F33.2] 10/05/2015   Total Time spent with patient: 30 minutes  Past Psychiatric History: see HPI  Past Medical History:  Past Medical History:  Diagnosis Date  . Allergy   . Anxiety   . Depression   . Prediabetes     Past Surgical History:  Procedure Laterality Date  . BUNIONECTOMY     Family History:  Family History  Problem Relation Age of Onset  . Osteoporosis Mother   . Post-traumatic stress disorder Father   . Drug abuse Sister   . Epilepsy Brother   . Food Allergy Son   . Arthritis Maternal Grandmother   . Osteoporosis Maternal Grandmother   . Graves' disease Maternal Grandfather   . Cancer Maternal Grandfather   . Diabetes Paternal Grandfather   . Cancer Paternal Grandfather    Family Psychiatric  History:  See HPI Social History:  History  Alcohol Use No     History  Drug Use No    Social History   Social History  . Marital  status: Single    Spouse name: N/A  . Number of children: N/A  . Years of education: N/A   Social History Main Topics  . Smoking status: Never Smoker  . Smokeless tobacco: Never Used  . Alcohol use No  . Drug use: No  . Sexual activity: Yes    Partners: Female     Comment: same sex partner   Other Topics Concern  . None   Social History Narrative  . None   Additional Social History:   Sleep: Good  Appetite:  Good  Current Medications: Current Facility-Administered Medications  Medication Dose Route Frequency Provider Last Rate Last Dose  . acetaminophen (TYLENOL) tablet 650 mg  650 mg Oral Q4H PRN Charm RingsJamison Y Lord, NP      . alum & mag hydroxide-simeth (MAALOX/MYLANTA) 200-200-20 MG/5ML suspension 30 mL  30 mL Oral Q4H PRN Charm RingsJamison Y Lord, NP   30 mL at 05/29/16 2112  . ARIPiprazole (ABILIFY) tablet 5 mg  5 mg Oral Daily Georgiann CockerVincent A Izediuno, MD   5 mg at 05/30/16 0752  . buPROPion [facility](WELLBUTRIN SR) 12 hr tablet 150 mg  150 mg Oral BID Adonis BrookSheila Sarita Hakanson, NP   150 mg at 05/30/16 0752  . citalopram (CELEXA) tablet 40 mg  40 mg Oral QPM Charm RingsJamison Y Lord, NP   40 mg at 05/29/16 1954  . hydrOXYzine (ATARAX/VISTARIL) tablet 25 mg  25  mg Oral QHS PRN Charm Rings, NP      . loratadine (CLARITIN) tablet 10 mg  10 mg Oral Daily Charm Rings, NP   10 mg at 05/30/16 4098  . magnesium hydroxide (MILK OF MAGNESIA) suspension 30 mL  30 mL Oral Daily PRN Charm Rings, NP      . mirtazapine (REMERON) tablet 15 mg  15 mg Oral QHS Charm Rings, NP   15 mg at 05/29/16 2111  . ondansetron (ZOFRAN) tablet 4 mg  4 mg Oral Q8H PRN Charm Rings, NP        Lab Results:  No results found for this or any previous visit (from the past 48 hour(s)).  Blood Alcohol level:  Lab Results  Component Value Date   ETH <5 05/25/2016   ETH <5 10/05/2015    Metabolic Disorder Labs: No results found for: HGBA1C, MPG No results found for: PROLACTIN No results found for: CHOL, TRIG, HDL, CHOLHDL, VLDL,  LDLCALC  Physical Findings: AIMS: Facial and Oral Movements Muscles of Facial Expression: None, normal Lips and Perioral Area: None, normal Jaw: None, normal Tongue: None, normal,Extremity Movements Upper (arms, wrists, hands, fingers): None, normal Lower (legs, knees, ankles, toes): None, normal, Trunk Movements Neck, shoulders, hips: None, normal, Overall Severity Severity of abnormal movements (highest score from questions above): None, normal Incapacitation due to abnormal movements: None, normal Patient's awareness of abnormal movements (rate only patient's report): No Awareness, Dental Status Current problems with teeth and/or dentures?: No Does patient usually wear dentures?: No  CIWA:    COWS:     Musculoskeletal: Strength & Muscle Tone: within normal limits Gait & Station: normal Patient leans: N/A  Psychiatric Specialty Exam: Physical Exam  Nursing note and vitals reviewed.   ROS  Blood pressure (!) 108/58, pulse 79, temperature 98.5 F (36.9 C), temperature source Oral, resp. rate 18, height 5\' 3"  (1.6 m), weight 62.6 kg (138 lb), last menstrual period 05/11/2016.Body mass index is 24.45 kg/m.  General Appearance: Casual  Eye Contact:  Good  Speech:  Normal Rate  Volume:  Normal  Mood:  Depressed  Affect:  Tearful  Thought Process:  Coherent  Orientation:  Full (Time, Place, and Person)  Thought Content:  Rumination  Suicidal Thoughts:  No  denies  Homicidal Thoughts:  No  Memory:  Immediate;   Good Recent;   Good Remote;   Good  Judgement:  Good  Insight:  Good  Psychomotor Activity:  Normal  Concentration:  Concentration: Good and Attention Span: Good  Recall:  Good  Fund of Knowledge:  Good  Language:  Good  Akathisia:  No  Handed:  Right  AIMS (if indicated):     Assets:  Communication Skills Desire for Improvement Resilience  ADL's:  Intact  Cognition:  WNL  Sleep:  Number of Hours: 5.25   Treatment Plan Summary: Review of chart, vital  signs, medications, and notes.  1-Individual and group therapy  2-Medication management for depression and anxiety: Medications reviewed with the patient.   -Continue Citalopram at 40 mg daily -Cont Aripiprazole 5 mg daily -Cont Bupropion SR to  150 mg BID 3-Coping skills for depression, anxiety  4-Continue crisis stabilization and management  5-Address health issues--monitoring vital signs, stable  6-Treatment plan in progress to prevent relapse of depression and anxiety 7- Lab work TSH 0.498 and EKG Normal ECG  Velna Hatchet May Marieclaire Bettenhausen, NP [facility] 05/30/2016, 1:24 PM

## 2016-05-30 NOTE — Progress Notes (Signed)
Adult Psychoeducational Group Note  Date:  05/30/2016 Time:  0915  Group Topic/Focus:  Coping With Mental Health Crisis:   The purpose of this group is to help patients identify strategies for coping with mental health crisis.  Group discusses possible causes of crisis and ways to manage them effectively.  Participation Level:  Active  Participation Quality:  Appropriate  Affect:  Appropriate  Cognitive:  Appropriate  Insight: Appropriate  Engagement in Group:  Engaged  Modes of Intervention:  Activity, Discussion and Education  Additional Comments:    Jream Broyles L 05/30/2016, 2:41 PM

## 2016-05-30 NOTE — Progress Notes (Signed)
Data. Patient denies SI/HI/AVH. Patient interacting well with staff and other patients. Affect flat most of the time, but does brighten with interaction. On her self assessment she reports 3/10 for depression and hopelessness and 0/10 for anxiety. 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 Emotional support and encouragement offered. Education provided on medication, indications and side effect. Q 15 minute checks done for safety. Response. Safety on the unit maintained through 15 minute checks.  Medications taken as prescribed. Attended groups. Remained calm and appropriate through out shift.

## 2016-05-31 MED ORDER — BUPROPION HCL ER (SR) 150 MG PO TB12: 150.0000 mg | ORAL_TABLET | Freq: Two times a day (BID) | ORAL | 0 refills | Status: DC

## 2016-05-31 MED ORDER — HYDROXYZINE HCL 25 MG PO TABS: 25.0000 mg | ORAL_TABLET | Freq: Every evening | ORAL | 0 refills | Status: AC | PRN

## 2016-05-31 MED ORDER — MIRTAZAPINE 15 MG PO TABS: 15.0000 mg | ORAL_TABLET | Freq: Every day | ORAL | 0 refills | Status: DC

## 2016-05-31 MED ORDER — ARIPIPRAZOLE 5 MG PO TABS: 5.0000 mg | ORAL_TABLET | Freq: Every day | ORAL | 0 refills | Status: DC

## 2016-05-31 MED ORDER — CITALOPRAM HYDROBROMIDE 40 MG PO TABS: 40.0000 mg | ORAL_TABLET | Freq: Every evening | ORAL | 0 refills | Status: AC

## 2016-05-31 NOTE — [facility] Group Notes (Signed)
[facility] Group Notes: (Clinical Social Work)   05/31/2016      Type of Therapy:  Group Therapy   Participation Level:  Did Not Attend - had been discharged    Ambrose MantleMareida Grossman-Orr, LCSW 05/31/2016, 6:04 PM

## 2016-05-31 NOTE — [facility] Group Notes (Signed)
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    Cranford MonBeaudry, Azul Brumett Evans 05/31/2016, 11:30 AM

## 2016-05-31 NOTE — Discharge Summary (Signed)
Physician Discharge Summary Note  Patient:  Suzanne Hanson is an 41 y.o., female MRN:  [id number] DOB:  [birth year] Patient phone:  [phone] (home)  Patient address:   [address] [place] [address],  Total Time spent with patient: 30 minutes  Date of Admission:  05/26/2016 Date of Discharge: 05/31/2016  Reason for Admission:  Recurrent MDD, developing SI  Principal Problem: Major depressive disorder, recurrent severe without psychotic features [facility]) Discharge Diagnoses: Patient Active Problem List   Diagnosis Date Noted  . Major depressive disorder, recurrent severe without psychotic features (HCC) [F33.2] 05/26/2016    Priority: High  . MDD (major depressive disorder), recurrent severe, without psychosis (HCC) [F33.2] 10/05/2015    Past Psychiatric History: see HPI  Past Medical History:  Past Medical History:  Diagnosis Date  . Allergy   . Anxiety   . Depression   . Prediabetes     Past Surgical History:  Procedure Laterality Date  . BUNIONECTOMY     Family History:  Family History  Problem Relation Age of Onset  . Osteoporosis Mother   . Post-traumatic stress disorder Father   . Drug abuse Sister   . Epilepsy Brother   . Food Allergy Son   . Arthritis Maternal Grandmother   . Osteoporosis Maternal Grandmother   . Graves' disease Maternal Grandfather   . Cancer Maternal Grandfather   . Diabetes Paternal Grandfather   . Cancer Paternal Grandfather    Family Psychiatric  History: see HPI Social History:  History  Alcohol Use No     History  Drug Use No    Social History   Social History  . Marital status: Single    Spouse name: N/A  . Number of children: N/A  . Years of education: N/A   Social History Main Topics  . Smoking status: Never Smoker  . Smokeless tobacco: Never Used  . Alcohol use No  . Drug use: No  . Sexual activity: Yes    Partners: Female     Comment: same sex partner   Other Topics Concern  . None   Social  History Narrative  . None    Hospital Course:  Suzanne Hanson, 41 yo, came in with recurrent MDD, worsening depression and developed SI.    Suzanne Hanson was admitted for Major depressive disorder, recurrent severe without psychotic features (HCC) and crisis management.  Patient was treated with medications with their indications listed below in detail under Medication List.  Medical problems were identified and treated as needed.  Home medications were restarted as appropriate.  Improvement was monitored by observation and Suzanne Hanson daily report of symptom reduction.  Emotional and mental status was monitored by daily self inventory reports completed by Suzanne Hanson and clinical staff.  Patient reported continued improvement, denied any new concerns.  Patient had been compliant on medications and denied side effects.  Support and encouragement was provided.         Suzanne Hanson was evaluated by the treatment team for stability and plans for continued recovery upon discharge.  Patient was offered further treatment options upon discharge including Residential, Intensive Outpatient and Outpatient treatment. Patient will follow up with agency listed below for medication management and counseling.  Encouraged patient to maintain satisfactory support network and home environment.  Advised to adhere to medication compliance and outpatient treatment follow up.  Prescriptions provided.       Suzanne Hanson motivation was an integral factor for scheduling further treatment.  Employment, transportation, bed availability, health  status, family support, and any pending legal issues were also considered during patient's hospital stay.  Upon completion of this admission the patient was both mentally and medically stable for discharge denying suicidal/homicidal ideation, auditory/visual/tactile hallucinations, delusional thoughts and paranoia.      Physical Findings: AIMS: Facial and Oral  Movements Muscles of Facial Expression: None, normal Lips and Perioral Area: None, normal Jaw: None, normal Tongue: None, normal,Extremity Movements Upper (arms, wrists, hands, fingers): None, normal Lower (legs, knees, ankles, toes): None, normal, Trunk Movements Neck, shoulders, hips: None, normal, Overall Severity Severity of abnormal movements (highest score from questions above): None, normal Incapacitation due to abnormal movements: None, normal Patient's awareness of abnormal movements (rate only patient's report): No Awareness, Dental Status Current problems with teeth and/or dentures?: No Does patient usually wear dentures?: No  CIWA:    COWS:     Musculoskeletal: Strength & Muscle Tone: within normal limits Gait & Station: normal Patient leans: N/A  Psychiatric Specialty Exam:  SEE MD SRA Physical Exam  ROS  Blood pressure 122/71, pulse 74, temperature 98.5 F (36.9 C), temperature source Oral, resp. rate 18, height 5\' 3"  (1.6 m), weight 62.6 kg (138 lb), last menstrual period 05/11/2016.Body mass index is 24.45 kg/m.   Have you used any form of tobacco in the last 30 days? (Cigarettes, Smokeless Tobacco, Cigars, and/or Pipes): No  Has this patient used any form of tobacco in the last 30 days? (Cigarettes, Smokeless Tobacco, Cigars, and/or Pipes) Yes, N/A  Blood Alcohol level:  Lab Results  Component Value Date   ETH <5 05/25/2016   ETH <5 10/05/2015    Metabolic Disorder Labs:  No results found for: HGBA1C, MPG No results found for: PROLACTIN No results found for: CHOL, TRIG, HDL, CHOLHDL, VLDL, LDLCALC  See Psychiatric Specialty Exam and Suicide Risk Assessment completed by Attending Physician prior to discharge.  Discharge destination:  Home  Is patient on multiple antipsychotic therapies at discharge:  No   Has Patient had three or more failed trials of antipsychotic monotherapy by history:  No  Recommended Plan for Multiple Antipsychotic  Therapies: NA   Allergies as of 05/31/2016   No Known Allergies     Medication List    STOP taking these medications   DULoxetine 60 MG capsule Commonly known as:  CYMBALTA   hydrOXYzine 25 MG capsule Commonly known as:  VISTARIL   loratadine 10 MG tablet Commonly known as:  CLARITIN   REXULTI 2 MG Tabs Generic drug:  Brexpiprazole     TAKE these medications     Indication  ARIPiprazole 5 MG tablet Commonly known as:  ABILIFY Take 1 tablet (5 mg total) by mouth daily. Start taking on:  06/01/2016  Indication:  mood stabilization   buPROPion 150 MG 12 hr tablet Commonly known as:  WELLBUTRIN SR Take 1 tablet (150 mg total) by mouth 2 (two) times daily.  Indication:  Major Depressive Disorder   citalopram 40 MG tablet Commonly known as:  CELEXA Take 1 tablet (40 mg total) by mouth every evening. What changed:  medication strength  Another medication with the same name was removed. Continue taking this medication, and follow the directions you see here.  Indication:  Depression   hydrOXYzine 25 MG tablet Commonly known as:  ATARAX/VISTARIL Take 1 tablet (25 mg total) by mouth at bedtime as needed for anxiety.  Indication:  Anxiety Neurosis   mirtazapine 15 MG tablet Commonly known as:  REMERON Take 1 tablet (15 mg total) by  mouth at bedtime. What changed:  additional instructions  Indication:  Trouble Sleeping, Major Depressive Disorder      Follow-up Information    [facility] Follow up on 06/17/2016.   Specialty:  Behavioral Health Why:  at 1:30pm for medication management with Ellis Savage, NP.  Also, you have reported having an earlier appointment (next week) and should call and confirm that date/time. Contact information: [address] [place] [address] [phone]        [facility] Follow up.   Specialty:  Behavioral Health Why:  Social worker will call you at  [phone] after making an appointment with the Intensive Outpatient Program for a start date and time. Contact information: [address] [place] [address] [phone]       [facility] Follow up.   Why:  After you have completed the Intensive Outpatient Program at [facility], you will want to return to see your therapist. Contact information: New location         Follow-up recommendations:  Activity:  as tol Diet:  as tol  Comments:  1.  Take all your medications as prescribed.   2.  Report any adverse side effects to outpatient provider. 3.  Patient instructed to not use alcohol or illegal drugs while on prescription medicines. 4.  In the event of worsening symptoms, instructed patient to call 911, the crisis hotline or go to nearest emergency room for evaluation of symptoms.  Signed: Lindwood Qua, NP [facility] 05/31/2016, 10:10 AM

## 2016-05-31 NOTE — Progress Notes (Addendum)
  [facility] Dis[facility] :  Will you be returning to the same living situation after discharge:  Yes,  alone half the time, with 3 children half the time At discharge, do you have transportation home?: Yes,  car in parking lot Do you have the ability to pay for your medications: Yes,  switched to generics while in hospital  Release of information consent forms completed and in the chart;  Patient's signature needed at discharge.  Patient to Follow up at: Follow-up Information    [facility] Follow up on 06/17/2016.   Specialty:  Behavioral Health Why:  at 1:30pm for medication management with Ellis SavageLisa Poulos, NP.  Also, you have reported having an earlier appointment (next week) and should call and confirm that date/time. Contact information: [address] [place] [address] [phone]        [facility] Follow up on 06/02/2016.   Specialty:  Behavioral Health Why:  at 8:45am with Jeri Modenaita clark for your initial assessment for the Intensive Outpatient Program Contact information: [address] [place] [id number] [phone]       [facility] Follow [facility].   Why:  After you have completed the [facility], you will want to return to see your therapist, [facility] Showfety. Contact information: [address], [place], [place] [address] Phone: [phone]          Next level of care provider has access to [facility] Link:no  Safety Planning and Suicide Prevention discussed: Yes,  with mother and with patient  Have you used any form of tobacco in the last 30 days? (Cigarettes, Smokeless Tobacco, Cigars, and/or Pipes): No  Has patient been referred to the Quitline?: N/A patient is not a smoker  Patient has been referred for addiction treatment: N/A  Lynnell ChadMareida J Grossman-Orr 05/31/2016, 9:25 AM

## 2016-05-31 NOTE — [facility] Suicide Risk Assessment (Signed)
[facility] Discharge Suicide Risk Assessment   Principal Problem: Major depressive disorder, recurrent severe without psychotic features [facility]) Discharge Diagnoses:  Patient Active Problem List   Diagnosis Date Noted  . Major depressive disorder, recurrent severe without psychotic features (HCC) [F33.2] 05/26/2016  . MDD (major depressive disorder), recurrent severe, without psychosis (HCC) [F33.2] 10/05/2015    Total Time spent with patient: 20 minutes  Musculoskeletal: Strength & Muscle Tone: within normal limits Gait & Station: normal Patient leans: N/A  Psychiatric Specialty Exam: Review of Systems  Psychiatric/Behavioral: Positive for depression. Negative for hallucinations, substance abuse and suicidal ideas. The patient has insomnia. The patient is not nervous/anxious.   All other systems reviewed and are negative.   Blood pressure 122/71, pulse 74, temperature 98.5 F (36.9 C), temperature source Oral, resp. rate 18, height 5\' 3"  (1.6 m), weight 138 lb (62.6 kg), last menstrual period 05/11/2016.Body mass index is 24.45 kg/m.  General Appearance: Casual  Eye Contact::  Good  Speech:  Clear and Coherent409  Volume:  Normal  Mood:  "GOOD"  Affect:  slightly down  Thought Process:  Coherent and Goal Directed  Orientation:  Full (Time, Place, and Person)  Thought Content:  Logical Perceptions: denies AH/VH  Suicidal Thoughts:  No  Homicidal Thoughts:  No  Memory:  Immediate;   Good Recent;   Good Remote;   Good  Judgement:  Good  Insight:  Fair  Psychomotor Activity:  Normal  Concentration:  Good  Recall:  Good  Fund of Knowledge:Good  Language: Good  Akathisia:  No  Handed:  Right  AIMS (if indicated):     Assets:  Communication Skills Desire for Improvement  Sleep:  Number of Hours: 6  Cognition: WNL  ADL's:  Intact   Mental Status Per Nursing Assessment::   On Admission:  Suicidal ideation indicated by patient, Suicide plan, Self-harm thoughts, Self-harm  behaviors  Demographic Factors:  Gay, lesbian, or bisexual orientation and Unemployed  Loss Factors: Loss of significant relationship  Historical Factors: NA  Risk Reduction Factors:   Responsible for children under 77 years of age  Continued Clinical Symptoms:  Depression:   Hopelessness  Cognitive Features That Contribute To Risk:  None    Suicide Risk:  Mild:  Suicidal ideation of limited frequency, intensity, duration, and specificity.  There are no identifiable plans, no associated intent, mild dysphoria and related symptoms, good self-control (both objective and subjective assessment), few other risk factors, and identifiable protective factors, including available and accessible social support.  Follow-up Information    [facility] Follow up on 06/17/2016.   Specialty:  Behavioral Health Why:  at 1:30pm for medication management with Ellis Savage, NP.  Also, you have reported having an earlier appointment (next week) and should call and confirm that date/time. Contact information: [address] [place] [address] [phone]        [facility] Follow up.   Specialty:  Behavioral Health Why:  Social worker will call you at [phone] after making an appointment with the Intensive Outpatient Program for a start date and time. Contact information: [address] [place] [address] [phone]       [facility] Follow up.   Why:  After you have completed the Intensive Outpatient Program at [facility], you will want to return to see your therapist. Contact information: New location         Patient states that she was  discouraged while searching for jobs and had separation. She believes that she learned coping kills of mental filtering and has hope that she would do better. She denies SI. She will be discharged to home. She agrees to  continue current regimen and have follow up appointment as above.   Plan Of Care/Follow-up recommendations:  Activity:  regular Diet:  regular Tests:  n/a Other:  n/a  Neysa Hottereina Natayah Warmack, MD 05/31/2016, 10:07 AM

## 2016-05-31 NOTE — Progress Notes (Signed)
Data. Patient denies SI/HI/AVH. Patient interacting well with staff and other patients. Affect continues to be flat. Mood is improved, per patient. Patient reports being, "Thankful", that she is leaving today. On her self assessment she reports 1/10 for depression and hopelessness, and 0/10 for anxiety. Her goal for today is: "Add tomy list of tasks for when I am released".  Action. Emotional support and encouragement offered. Education provided on medication, indications and side effect. Q 15 minute checks done for safety. Response. Safety on the unit maintained through 15 minute checks.  Medications taken as prescribed. Attended groups. Remained calm and appropriate through out shift.  Pt. discharged to lobby. Belongings sheet reviewed and signed by pt. and all belongings, from locker #26 as well as those at bedside and scripts, sent home. Paperwork reviewed and pt. able to verbalize understanding of education. Pt. in no current distress and ambulatory.

## 2016-06-02 ENCOUNTER — Encounter (HOSPITAL_COMMUNITY): Payer: Self-pay | Admitting: Psychiatry

## 2016-06-02 ENCOUNTER — Other Ambulatory Visit (HOSPITAL_COMMUNITY): Payer: BC Managed Care – PPO | Attending: Psychiatry | Admitting: Psychiatry

## 2016-06-02 DIAGNOSIS — F332 Major depressive disorder, recurrent severe without psychotic features: Secondary | ICD-10-CM

## 2016-06-02 DIAGNOSIS — F339 Major depressive disorder, recurrent, unspecified: Secondary | ICD-10-CM | POA: Diagnosis present

## 2016-06-02 NOTE — Progress Notes (Signed)
Psychiatric Initial Adult Assessment   Patient Identification: Suzanne Hanson MRN:  [id number] Date of Evaluation:  06/02/2016 Referral Source: [facility] inpatient Chief Complaint:   Chief Complaint    Depression; Anxiety; Stress     Visit Diagnosis: major depression, recurrent severe without psychotic features  History of Present Illness:  Suzanne Hanson has had serious problems with depression since separating from and eventually divorcing her spouse.  It has thrown her for an emotional loop and resulted in 3 hospitalizations for suicidal thinking.  The other stressor is the loss of the friends they had in common.  Almost no one stayed with her.  They have 3 children and share custody 50/50.  That is working well and she has made a point to stay close by to be with them even though to get a better job she would have to move.  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  Cannot find another job in town to support herself.  Good jobs else where but chooses to stay close to the children.  Currently feeling better after the last inpatient stay.  Associated Signs/Symptoms: Depression Symptoms:  depressed mood, anhedonia, insomnia, fatigue, difficulty concentrating, anxiety, (Hypo) Manic Symptoms:  Irritable Mood, Anxiety Symptoms:  Excessive Worry, Psychotic Symptoms:  none PTSD Symptoms: Negative  Past Psychiatric History: recent hospitalizations in the last year and sees outpatient therapist and provider  Previous Psychotropic Medications: Yes   Substance Abuse History in the last 12 months:  No.  Consequences of Substance Abuse: Negative  Past Medical History:  Past Medical History:  Diagnosis Date  . Allergy   . Anxiety   . Depression   . Prediabetes     Past Surgical History:  Procedure Laterality Date  . BUNIONECTOMY      Family Psychiatric History: none  Family History:   Family History  Problem Relation Age of Onset  . Osteoporosis Mother   . Post-traumatic stress disorder Father   . Drug abuse Sister   . Depression Sister   . Anxiety disorder Sister   . Epilepsy Brother   . Food Allergy Son   . Arthritis Maternal Grandmother   . Osteoporosis Maternal Grandmother   . Graves' disease Maternal Grandfather   . Cancer Maternal Grandfather   . Diabetes Paternal Grandfather   . Cancer Paternal Grandfather     Social History:   Social History   Social History  . Marital status: Single    Spouse name: N/A  . Number of children: N/A  . Years of education: N/A   Social History Main Topics  . Smoking status: Never Smoker  . Smokeless tobacco: Never Used  . Alcohol use No  . Drug use: No  . Sexual activity: Not Currently    Partners: Female     Comment: same sex partner   Other Topics Concern  . None   Social History Narrative  . None    Additional Social History: none  Allergies:  No Known Allergies  Metabolic Disorder Labs: No results found for: HGBA1C, MPG No results found for: PROLACTIN No results found for: CHOL, TRIG, HDL, CHOLHDL, VLDL, LDLCALC   Current Medications: Current Outpatient Prescriptions  Medication Sig Dispense Refill  . ARIPiprazole (ABILIFY) 5 MG tablet Take 1 tablet (5 mg total) by mouth daily. 30 tablet 0  . buPROPion (WELLBUTRIN SR) 150 MG 12 hr tablet Take 1 tablet (  150 mg total) by mouth 2 (two) times daily. 30 tablet 0  . citalopram (CELEXA) 40 MG tablet Take 1 tablet (40 mg total) by mouth every evening. 30 tablet 0  . hydrOXYzine (ATARAX/VISTARIL) 25 MG tablet Take 1 tablet (25 mg total) by mouth at bedtime as needed for anxiety. 30 tablet 0  . mirtazapine (REMERON) 15 MG tablet Take 1 tablet (15 mg total) by mouth at bedtime. 30 tablet 0   No current facility-administered medications for this visit.     Neurologic: Headache: Negative Seizure:  Negative Paresthesias:Negative  Musculoskeletal: Strength & Muscle Tone: within normal limits Gait & Station: normal Patient leans: N/A  Psychiatric Specialty Exam: ROS  Last menstrual period 05/11/2016.There is no height or weight on file to calculate BMI.  General Appearance: Well Groomed  Eye Contact:  Good  Speech:  Clear and Coherent  Volume:  Normal  Mood:  Depressed  Affect:  Congruent  Thought Process:  Coherent and Goal Directed  Orientation:  Full (Time, Place, and Person)  Thought Content:  Logical  Suicidal Thoughts:  No  Homicidal Thoughts:  No  Memory:  Immediate;   Good Recent;   Good Remote;   Good  Judgement:  Good  Insight:  Good  Psychomotor Activity:  Normal  Concentration:  Concentration: Good and Attention Span: Good  Recall:  Good  Fund of Knowledge:Good  Language: Good  Akathisia:  Negative  Handed:  Right  AIMS (if indicated):  0  Assets:  Communication Skills Desire for Improvement Financial Resources/Insurance Housing Leisure Time Physical Health Resilience Social Support Talents/Skills Transportation Vocational/Educational  ADL's:  Intact  Cognition: WNL  Sleep:  adequate    Treatment Plan Summary: Admit to IOP with daily groups.  Continue current medications   Carolanne GrumblingGerald Quentyn Kolbeck, MD 1/23/20182:34 PM

## 2016-06-02 NOTE — Progress Notes (Signed)
Comprehensive Clinical Assessment (CCA) Note  06/02/2016 Suzanne Hanson [id number]  Visit Diagnosis:   No diagnosis found.    CCA Part One  Part One has been completed on paper by the patient.  (See scanned document in Chart Review)  CCA Part Two A  Intake/Chief Complaint:  CCA Intake With Chief Complaint CCA Part Two Date: 06/02/16 CCA Part Two Time: 1538 Chief Complaint/Presenting Problem: This is a 41 yo separated, employed, Caucasian female, who transitioned from the inpatient unit at [facility].  According to pt, she has had three admissions within seven months due to worsening depressive symptoms with SI.  Discussed safety options with pt at length.  Pt is able to contract for safety.  Background history of MDD recurrent. Recently presented to the ER with worsening depression and increasing thoughts of suicide. Reports that she has been preoccupied with thoughts of going into the woods and slit her carotids open.  Triggers/Stressors:  1)  Unresolved grief/loss issues:  recently separated (October 2017) from wife of seventeen yrs.  "My new lifestyle is more stressful."  Parents bought her a house seven minutes away from her ex-wife.  Custody of the kids is being rotated every two to three days.  States the kids are coping well.  "It has been difficult dealing with mutual friends that work with my ex-wife.  I miss those friends.  Only ~ two have reached out to me."  Pt has been so down and has not been able to make new friends herself.  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  "My job isn't rewarding at all like the job I had at [employer]."  Pt is currenly looking into other options.  Pt currenlty sees Jake Seats, NP and Kristin Bruins, APRN on an outpt basis.  Family Hx:  Sister  (depression and anxiety); Mother (Dysthymia)    Patients Currently Reported Symptoms/Problems: Sadness, irritability, low self-esteem, indecisiveness, anhedonia, loss of motivation, decreased  sleep, tearfulness, SI, poor concentration Collateral Involvement: Family is supportive Individual's Strengths: Pt is motivated for treatment. Type of Services Patient Feels Are Needed: MH-IOP  Mental Health Symptoms Depression:  Depression: Change in energy/activity, Difficulty Concentrating, Irritability, Sleep (too much or little), Tearfulness, Worthlessness  Mania:  Mania: N/A  Anxiety:   Anxiety: Worrying  Psychosis:  Psychosis: N/A  Trauma:  Trauma: N/A  Obsessions:  Obsessions: N/A  Compulsions:     Inattention:     Hyperactivity/Impulsivity:     Oppositional/Defiant Behaviors:  Oppositional/Defiant Behaviors: N/A  Borderline Personality:     Other Mood/Personality Symptoms:      Mental Status Exam Appearance and self-care  Stature:  Stature: Average  Weight:  Weight: Average weight  Clothing:  Clothing: Casual  Grooming:  Grooming: Normal  Cosmetic use:  Cosmetic Use: None  Posture/gait:  Posture/Gait: Normal  Motor activity:  Motor Activity: Not Remarkable  Sensorium  Attention:  Attention: Normal  Concentration:  Concentration: Normal  Orientation:  Orientation: X5  Recall/memory:  Recall/Memory: Normal  Affect and Mood  Affect:  Affect: Depressed  Mood:  Mood: Anxious  Relating  Eye contact:  Eye Contact: Normal  Facial expression:  Facial Expression: Responsive  Attitude toward examiner:  Attitude Toward Examiner: Cooperative  Thought and Language  Speech flow: Speech Flow: Normal  Thought content:  Thought Content: Appropriate to mood and circumstances  Preoccupation:     Hallucinations:     Organization:     [employer]  Knowledge:  Fund of Knowledge: Average  Intelligence:  Intelligence: Average  Abstraction:  Abstraction: Normal  Judgement:  Judgement: Normal  Reality Testing:  Reality Testing: Adequate  Insight:  Insight: Good  Decision Making:  Decision Making: Impulsive  Social Functioning  Social Maturity:  Social Maturity:  Isolates  Social Judgement:  Social Judgement: Normal  Stress  Stressors:  Stressors: Veterinary surgeonGrief/losses, Work  Coping Ability:  Coping Ability: Building surveyorverwhelmed  Skill Deficits:     Supports:      Family and Psychosocial History: Family history Marital status: Separated Separated, when?: Oct. 2017 What types of issues is patient dealing with in the relationship?: 11 YO adopted child has attachment issues; parenting of 41 YO twins, same sex partner; wife has meaningful work here in [place] whereas pt does not Additional relationship information: Some disconnect during parenting years Are you sexually active?: No What is your sexual orientation?: Same sex relationship Has your sexual activity been affected by drugs, alcohol, medication, or emotional stress?: at times Does patient have children?: Yes How many children?: 3 How is patient's relationship with their children?: most challenging w 41 yo adopted daughter; some stress with demands of parenting her along with two 41 YO twins; relationships are good overall  Childhood History:  Childhood History By whom was/is the patient raised?: Both parents Additional childhood history information: Born in [place]. Age 47 moved to [place], age 58 moved to [place] to attend middle and high school.  Reports no problems in school.  Good childhood.  Denies any trauma or abuse. Description of patient's relationship with caregiver when they were a child: Good w both Does patient have siblings?: Yes Number of Siblings: 2 Description of patient's current relationship with siblings: Good relationship with younger brother (suffers with frequent seizures:  resides with parents) and younger sister (suffers with social anxiety and depression) Did patient suffer any verbal/emotional/physical/sexual abuse as a child?: No Did patient suffer from severe childhood neglect?: No Has patient ever been sexually abused/assaulted/raped as an adolescent or adult?: No Was  the patient ever a victim of a crime or a disaster?: No Witnessed domestic violence?: No Has patient been effected by domestic violence as an adult?: No  CCA Part Two B  Employment/Work Situation: Employment / Work Psychologist, occupationalituation Employment situation: Employed 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 How long has patient been employed?: 2 years Patient's job has been impacted by current illness: No Has patient ever been in the Eli [employer] and Companymilitary?: No Has patient ever served in combat?: No Did You Receive Any Psychiatric Treatment/Services While in Equities traderthe Military?: No Are There Guns or Other Weapons in Your Home?: No  Education: Education Did Garment/textile technologistYou Graduate From [employer]?: Yes Did Theme park managerYou Attend College?: Yes Did Designer, television/film setYou Attend Graduate School?: Yes What is Your Occupational psychologistost Graduate Degree?: Masters in [employer] Health What Was Your Major?: Public Health Did You Have An Individualized Education Program (IIEP): No Did You Have Any Difficulty At [employer] EnergySchool?: No  Religion: Religion/Spirituality Are You A Religious Person?: No  Leisure/Recreation: Leisure / Recreation Leisure and Hobbies: Working out and reading  Exercise/Diet: Exercise/Diet Do You Exercise?: Yes What Type of Exercise Do You Do?: Swimming How Many Times a Week Do You Exercise?: 4-5 times a week Have You Gained or Lost A Significant Amount of Weight in the Past Six Months?: No Do You Follow a Special Diet?: No Do You Have Any Trouble Sleeping?: No  CCA Part Two C  Alcohol/Drug Use: Alcohol / Drug Use History of alcohol /  drug use?: No history of alcohol / drug abuse                      CCA Part Three  ASAM's:  Six Dimensions of Multidimensional Assessment  Dimension 1:  Acute Intoxication and/or Withdrawal Potential:     Dimension 2:  Biomedical Conditions and Complications:     Dimension 3:  Emotional, Behavioral, or Cognitive Conditions and Complications:     Dimension 4:  Readiness to Change:      Dimension 5:  Relapse, Continued use, or Continued Problem Potential:     Dimension 6:  Recovery/Living Environment:      Substance use Disorder (SUD)    Social Function:  Social Functioning Social Maturity: Isolates Social Judgement: Normal  Stress:  Stress Stressors: Grief/losses, Work Coping Ability: Overwhelmed Patient Takes Medications The Way The Doctor Instructed?: Yes Priority Risk: Moderate Risk  Risk Assessment- Self-Harm Potential: Risk Assessment For Self-Harm Potential Thoughts of Self-Harm: Vague current thoughts Method: No plan Availability of Means: No access/NA  Risk Assessment -Dangerous to Others Potential: Risk Assessment For Dangerous to Others Potential Method: No Plan Availability of Means: No access or NA Intent: Vague intent or NA Notification Required: No need or identified person  DSM5 Diagnoses: Patient Active Problem List   Diagnosis Date Noted  . Major depressive disorder, recurrent severe without psychotic features (HCC) 05/26/2016  . MDD (major depressive disorder), recurrent severe, without psychosis (HCC) 10/05/2015    Patient Centered Plan: Patient is on the following Treatment Plan(s):  Depression  Recommendations for Services/Supports/Treatments: Recommendations for Services/Supports/Treatments Recommendations For Services/Supports/Treatments: IOP (Intensive Outpatient Program)  Treatment Plan Summary:  Orient pt to MH-IOP.  Pt will attend daily groups.  F/u with Jake Seats, NP and Kristin Bruins, APRN.  Encouraged support groups.  Referral to Vocational Rehab.  Referrals to Alternative Service(s): Referred to Alternative Service(s):   Place:   Date:   Time:    Referred to Alternative Service(s):   Place:   Date:   Time:    Referred to Alternative Service(s):   Place:   Date:   Time:    Referred to Alternative Service(s):   Place:   Date:   Time:     Doneshia Hill, RITA, M.Ed, CNA

## 2016-06-03 ENCOUNTER — Other Ambulatory Visit (HOSPITAL_COMMUNITY): Payer: BC Managed Care – PPO | Admitting: Psychiatry

## 2016-06-03 DIAGNOSIS — F332 Major depressive disorder, recurrent severe without psychotic features: Secondary | ICD-10-CM

## 2016-06-03 DIAGNOSIS — F339 Major depressive disorder, recurrent, unspecified: Secondary | ICD-10-CM | POA: Diagnosis not present

## 2016-06-04 ENCOUNTER — Other Ambulatory Visit (HOSPITAL_COMMUNITY): Payer: BC Managed Care – PPO | Admitting: Psychiatry

## 2016-06-04 DIAGNOSIS — F332 Major depressive disorder, recurrent severe without psychotic features: Secondary | ICD-10-CM

## 2016-06-04 DIAGNOSIS — F339 Major depressive disorder, recurrent, unspecified: Secondary | ICD-10-CM | POA: Diagnosis not present

## 2016-06-04 MED ORDER — BUPROPION HCL ER (XL) 150 MG PO TB24: 150.0000 mg | ORAL_TABLET | ORAL | 2 refills | Status: DC

## 2016-06-04 NOTE — Progress Notes (Signed)
    Daily Group Progress Note  Program: IOP   Group Time: 9:00-12:00   Participation Level:  active   Behavioral Response:  engaged   Type of Therapy:  group therapy   Summary of Progress: This was the pt's first day in group. She shared that she'd come from inpatient and had tried to take her own life. She also discussed the circumstnaces surrounding her attempt. She's felt very isolated since her divorce, and what she considers the abandonment of many of her friends. She also is very unhappy and bored with her job. In spite of her difficulties she is currently looking for new work, and has begun exercising regularly by swimming in the mornings.     Shaune PollackBrown, Jennifer B, LPC

## 2016-06-04 NOTE — Progress Notes (Signed)
    Daily Group Progress Note  Program: IOP  Group Time:  9:00-12:00  Participation Level: Active  Behavioral Response: Appropriate  Type of Therapy:  Group Therapy  Summary of Progress: Pt. Continues to present as quiet with primarily flat affect. Pt. Is observant and responsive to questions from therapist. Pt. Discussed that she exercises daily to help with her mood and noticed that she was more consistent with her exercise when she started exercising 5X a week. Pt. Discussed challenges of raising small children and stress related to job dissatisfaction. Pt. Participated in discussion about changing our relationships to our feelings by developing a nonjudgmental stance toward them.      Shaune PollackBrown, Tyrick Dunagan B, LPC

## 2016-06-05 ENCOUNTER — Other Ambulatory Visit (HOSPITAL_COMMUNITY): Payer: BC Managed Care – PPO | Admitting: Psychiatry

## 2016-06-05 DIAGNOSIS — F339 Major depressive disorder, recurrent, unspecified: Secondary | ICD-10-CM | POA: Diagnosis not present

## 2016-06-05 DIAGNOSIS — F332 Major depressive disorder, recurrent severe without psychotic features: Secondary | ICD-10-CM

## 2016-06-08 ENCOUNTER — Other Ambulatory Visit (HOSPITAL_COMMUNITY): Payer: BC Managed Care – PPO | Admitting: Psychiatry

## 2016-06-08 DIAGNOSIS — F332 Major depressive disorder, recurrent severe without psychotic features: Secondary | ICD-10-CM

## 2016-06-08 DIAGNOSIS — F339 Major depressive disorder, recurrent, unspecified: Secondary | ICD-10-CM | POA: Diagnosis not present

## 2016-06-09 ENCOUNTER — Other Ambulatory Visit (HOSPITAL_COMMUNITY): Payer: BC Managed Care – PPO | Admitting: Psychiatry

## 2016-06-09 DIAGNOSIS — F332 Major depressive disorder, recurrent severe without psychotic features: Secondary | ICD-10-CM

## 2016-06-09 DIAGNOSIS — F339 Major depressive disorder, recurrent, unspecified: Secondary | ICD-10-CM | POA: Diagnosis not present

## 2016-06-09 NOTE — Progress Notes (Signed)
    Daily Group Progress Note  Program: IOP Group Time: 9:00-12:00   Participation Level:  active    Behavioral Response: engaged   Type of Therapy:  group therapy   Summary of Progress:  Pt feels she's having a good day. She woke up at 5:30am and went to swim. She also shared that work is a major disappointment for her. She feels as if she is not fulfilling her life purpose. In many ways her kids and her need to be near them especially factor into this. She feels very stuck because much of the work she would like to do is in [place], but she stays in [place] to be near her children. Mental Health association visited and gave a presentation on their programs.  Shaune PollackBrown, Doryce Mcgregory B, LPC

## 2016-06-09 NOTE — Progress Notes (Signed)
    Daily Group Progress Note  Program: IOP Group Time: 9:00-12:00   Participation Level:  active    Behavioral Response: engaged   Type of Therapy: group therapy   Summary of Progress:  Pt shared a bit more about her dissatisfaction with her current work and her feelings of being trapped. She also had an insight about how her tendency to avoid conflict may have led to the downfall of her marriage. She struggles with feelings of guilt and regret for this way of avoiding conflict, however she also understands that her family modeled this pattern. Nadine CountsBob, the chaplain, came for the last hour of group and discussed grief.  Shaune PollackBrown, Avenell Sellers B, LPC

## 2016-06-10 ENCOUNTER — Other Ambulatory Visit (HOSPITAL_COMMUNITY): Payer: BC Managed Care – PPO

## 2016-06-11 ENCOUNTER — Other Ambulatory Visit (HOSPITAL_COMMUNITY): Payer: BC Managed Care – PPO | Attending: Psychiatry | Admitting: Psychiatry

## 2016-06-11 DIAGNOSIS — F332 Major depressive disorder, recurrent severe without psychotic features: Secondary | ICD-10-CM | POA: Diagnosis not present

## 2016-06-11 DIAGNOSIS — Z818 Family history of other mental and behavioral disorders: Secondary | ICD-10-CM | POA: Insufficient documentation

## 2016-06-11 DIAGNOSIS — F339 Major depressive disorder, recurrent, unspecified: Secondary | ICD-10-CM | POA: Diagnosis present

## 2016-06-12 ENCOUNTER — Other Ambulatory Visit (HOSPITAL_COMMUNITY): Payer: BC Managed Care – PPO | Admitting: Psychiatry

## 2016-06-12 DIAGNOSIS — F332 Major depressive disorder, recurrent severe without psychotic features: Secondary | ICD-10-CM

## 2016-06-13 NOTE — Progress Notes (Signed)
    Daily Group Progress Note  Program: IOP  Group Time: 9:00-12:00   Participation Level: active    Behavioral Response:  responsive   Type of Therapy:  group therapy   Summary of Progress:   Pt was very supportive of other group members, but over all did not share too much about her struggles. She went swimming before group in the morning, and is glad she made it to work out. She related to another pt who struggles with self acceptance. She often compares herself to others, and feels she comes up short. Pt continues to feel stuck in [place] and as if she is trapped at her work because of the pay and the need to pay for her children's schooling. Counselor's challenged pt's beliefs around being normal-questioning what normal is and if that is truly something to aspire to. Discussion around self acceptance was also had.   Shaune PollackBrown, Lauretta Sallas B, LPC

## 2016-06-15 ENCOUNTER — Other Ambulatory Visit (HOSPITAL_COMMUNITY): Payer: BC Managed Care – PPO | Admitting: Psychiatry

## 2016-06-15 DIAGNOSIS — F332 Major depressive disorder, recurrent severe without psychotic features: Secondary | ICD-10-CM | POA: Diagnosis not present

## 2016-06-15 NOTE — Progress Notes (Signed)
    Daily Group Progress Note  Program: IOP Group Time: 9:00-12:00   Participation Level:  active    Behavioral Response:engaged   Type of Therapy:  group therapy   Summary of Progress:  Pt has been feeling better since the weekend. She saw her children and they were very glad to see her. She is happy to be going back to a normal visitation schedule with them. However, she continues to feel very lonely since her divorce and the loss of her friends. Despite feeling lonely, she recently bought a bike and plans to join some meetup's that involve bike riding so she can make some new friends. The Pharm d. visited today and discussed medications for the first hour of group.   Shaune PollackBrown, Jennifer B, LPC

## 2016-06-15 NOTE — Progress Notes (Signed)
    Daily Group Progress Note  Program: IOP Group Time: 9:00-12:00   Participation Level: active   Behavioral Response:  engaged   Type of Therapy:  group therapy  Summary of Progress: Pt was in a positive mood, she'd had her children the night before and they had an enjoyable dinner at a restaurant. She reflected on the difference in her experience of depression from before she'd been checked into in patient to now. Though she still feels depressed, she has a lot more motivation than she did before. She continues to exercise and she feels she is reclaiming parts of her personality her ex-wife didn't allow her to express when they were together. Looking back on her relationship pt realizes how uncompromising her partner was. She continues to feel angry with herself for having not spoken up in the past-however she also recognizes that in the future she knows what she does not want and will not accept from a partner.    Shaune PollackBrown, Edwards Mckelvie B, LPC

## 2016-06-16 ENCOUNTER — Other Ambulatory Visit (HOSPITAL_COMMUNITY): Payer: BC Managed Care – PPO | Admitting: Psychiatry

## 2016-06-16 DIAGNOSIS — F332 Major depressive disorder, recurrent severe without psychotic features: Secondary | ICD-10-CM | POA: Diagnosis not present

## 2016-06-16 NOTE — Progress Notes (Signed)
Feels overly tired and un energetic.  Sleeps better since the buprorion was changed to once daily in the XL version.   Plan is to cut the aripiprazole to 2.5 mg daily and see if that helps energy.  Maybe discontinue it and/or increase the buproprion to 300 mg daily

## 2016-06-17 ENCOUNTER — Other Ambulatory Visit (HOSPITAL_COMMUNITY): Payer: BC Managed Care – PPO | Admitting: Psychiatry

## 2016-06-17 DIAGNOSIS — F332 Major depressive disorder, recurrent severe without psychotic features: Secondary | ICD-10-CM | POA: Diagnosis not present

## 2016-06-17 NOTE — Progress Notes (Signed)
    Daily Group Progress Note  Program: IOP  Group Time: 9:00-12:00  Participation Level: Active  Behavioral Response: Appropriate  Type of Therapy:  Group Therapy  Summary of Progress: Pt. Presents with brightened affect, more relaxed in group setting, continues to be responsive to prompts from the group counselor and to feedback from the other group members. Pt. Reported that she is feeling better and is seeing opportunities for connection with social supports, friends and community that did not seem available to her at the worst part of her depression crisis. Pt. Discussed that she has been able to work and is actively engaging with her children. Pt. Discussed ongoing job dissatisfaction, but feels more in control of exploring the career/job options that are available to her. Pt. Participated in discussion of "Just for today the choice is mine".    Shaune PollackBrown, Jere Vanburen B, LPC

## 2016-06-17 NOTE — Progress Notes (Signed)
    Daily Group Progress Note  Program: IOP Group Time: 9:00-12:00   Participation Level: active   Behavioral Response:  engaged   Type of Therapy:  group therapy  Summary of Progress: Q reported having a difficult weekend because she did not have her children this past weekend.  Q stated that weekends when she has her children seem to help her emotionally feel better.  Q reported feeling anxious about regressing into her depression because she stayed in bed a lot this weekend and did not get stuff done that she wanted to do.  We processed having compassion for herself and taking baby steps with her recovery; there will be inevitable setbacks but they do not necessarily mean emotional regression.   Shaune PollackBrown, Jeffie Spivack B, LPC

## 2016-06-18 ENCOUNTER — Other Ambulatory Visit (HOSPITAL_COMMUNITY): Payer: BC Managed Care – PPO

## 2016-06-19 ENCOUNTER — Other Ambulatory Visit (HOSPITAL_COMMUNITY): Payer: BC Managed Care – PPO

## 2016-06-20 NOTE — Progress Notes (Signed)
    Daily Group Progress Note  Program: IOP Group Time: 9:00-12:00   Participation Level:  active   Behavioral Response:  responsive   Type of Therapy:  group therapy  Summary of Progress: Pt has had her children for the last two days, and enjoyed her time with them. She feels that her time with her children is easier than her time spent alone. She continues to struggle with loneliness, and in a discussion around how we cope with loneliness she shared that making plans is one way she pulls herself out of that feeling. She is hoping to continue finding motivation, though it is hard for her to do so at times. Counselor spoke to recovery as a cycle in which we sometimes feel motivated, and other times don't, and acknowledging that there will be set backs, but that we can't let the setbacks stall our recovery process. Pt was receptive to this concept.   Shaune PollackBrown, Jennifer B, LPC

## 2016-06-22 ENCOUNTER — Other Ambulatory Visit (HOSPITAL_COMMUNITY): Payer: BC Managed Care – PPO | Admitting: Psychiatry

## 2016-06-22 DIAGNOSIS — F332 Major depressive disorder, recurrent severe without psychotic features: Secondary | ICD-10-CM

## 2016-06-22 NOTE — Progress Notes (Signed)
    Daily Group Progress Note  Program: IOP  Group Time: 9:00-12:00   Participation Level: active   Behavioral Response: engaged   Type of Therapy:  group therapy  Summary of Progress: Pt. seemed to relate to the conversation about relationships and asserting needs in relationships.  She stated that she has learned from her previous marriage things that she does not like in relationships and how to pick her battles.  Pt. also stated she was not ready to get into another relationship because she is working on herself. Pt continues to struggle with feelings of loneliness and abandonment due to the loss of her friends after the split with her wife.   Shaune PollackBrown, Jennifer B, LPC

## 2016-06-23 ENCOUNTER — Other Ambulatory Visit (HOSPITAL_COMMUNITY): Payer: BC Managed Care – PPO

## 2016-06-24 ENCOUNTER — Other Ambulatory Visit (HOSPITAL_COMMUNITY): Payer: BC Managed Care – PPO

## 2016-06-25 ENCOUNTER — Other Ambulatory Visit (HOSPITAL_COMMUNITY): Payer: BC Managed Care – PPO

## 2016-06-25 NOTE — Progress Notes (Signed)
    Daily Group Progress Note  Program: IOP  Group Time: 9:00-12:00   Participation Level:  active   Behavioral Response:  engaged   Type of Therapy:   group therapy  Summary of Progress: Pt shared about her weekend with her children-she enjoyed their time together on an family outing, however she continues to struggle with deep feelings of loneliness. Looking to her past counselor and pt explored how feelings of not fitting in as a child due to sexuality, ethnicity, and overall feelings of being different may have contributed to her current feelings of isolation. 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 Currently, she dislikes her work and finds it largely meaningless, and this seems to be related to her feelings of loneliness and lack of self worth. Pt also shared that she's never been alone in this way before, and counselor validated that her feelings of loneliness are in many ways a normal reaction to her situation. Later in the group we processed a worksheet around accepting feelings. Pt shared that she hopes to find a way to support herself in her loneliness by accepting that it's a transient feeling.         Shaune PollackBrown, Tien Aispuro B, LPC

## 2016-06-26 ENCOUNTER — Other Ambulatory Visit (HOSPITAL_COMMUNITY): Payer: BC Managed Care – PPO | Admitting: Psychiatry

## 2016-06-26 DIAGNOSIS — F332 Major depressive disorder, recurrent severe without psychotic features: Secondary | ICD-10-CM

## 2016-06-29 ENCOUNTER — Other Ambulatory Visit (HOSPITAL_COMMUNITY): Payer: BC Managed Care – PPO | Admitting: Psychiatry

## 2016-06-30 ENCOUNTER — Other Ambulatory Visit (HOSPITAL_COMMUNITY): Payer: BC Managed Care – PPO

## 2016-07-01 ENCOUNTER — Other Ambulatory Visit (HOSPITAL_COMMUNITY): Payer: BC Managed Care – PPO

## 2016-07-02 ENCOUNTER — Other Ambulatory Visit (HOSPITAL_COMMUNITY): Payer: BC Managed Care – PPO

## 2016-07-02 NOTE — Progress Notes (Signed)
    Daily Group Progress Note  Program: IOP  Group Time: 9:00-12:00   Participation Level:  active    Behavioral Response: engaged   Type of Therapy:   group therapy   Summary of Progress:  Pt. was fairly quiet in group today but said she was excited for plans this weekend which included dinner with friends and a bike ride in the nice weather.  Pt. related to the group process around parenting and loneliness.  Pt. discharges next week and has about two more sessions in group.   Shaune PollackBrown, Jennifer B, LPC

## 2016-07-03 ENCOUNTER — Other Ambulatory Visit (HOSPITAL_COMMUNITY): Payer: BC Managed Care – PPO | Admitting: Psychiatry

## 2016-07-03 DIAGNOSIS — F332 Major depressive disorder, recurrent severe without psychotic features: Secondary | ICD-10-CM | POA: Diagnosis not present

## 2016-07-03 NOTE — Progress Notes (Signed)
Suzanne Hanson is a 41 y.o. ,separated, employed, Caucasian female, who transitioned from the inpatient unit at [facility].  According to pt, she has had three admissions within seven months due to worsening depressive symptoms with SI.  Discussed safety options with pt at length.  Pt is able to contract for safety.  Background history of MDD recurrent. Recently presented to the ER with worsening depression and increasing thoughts of suicide. Reports that she has been preoccupied with thoughts of going into the woods and slit her carotids open.  Triggers/Stressors:  1)  Unresolved grief/loss issues:  recently separated (October 2017) from wife of seventeen yrs.  "My new lifestyle is more stressful."  Parents bought her a house seven minutes away from her ex-wife.  Custody of the kids is being rotated every two to three days.  States the kids are coping well.  "It has been difficult dealing with mutual friends that work with my ex-wife.  I miss those friends.  Only ~ two have reached out to me."  Pt has been so down and has not been able to make new friends herself.  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  "My job isn't rewarding at all like the job I had at [employer]."  Pt is currenly looking into other options.  Pt currenlty sees Jake Seats, NP and Kristin Bruins, APRN on an outpt basis.  Family Hx:  Sister  (depression and anxiety); Mother (Dysthymia). Pt has been skipping a few days in order to have off days from group; the plan was for her to return today for scheduled discharge.  Pt arrived stating that she had SI, no plan or intent.  Trigger, according to pt is the cloudy weather, which made her think about the friends who abandoned her.  "I just can't stop thinking about them."  Pt states although she is making new friendships, she really misses her old friends.  A:  Provided pt with support.  Discussed SAD and encouraged pt to get more light into her home (ie. Opening up curtains,  blinds and turning on lights).  Also, discussed at length structuring her time so she won't have so much downtime to ruminate.  Discussed inpatient hospitalization; pt declined.  Pt states she will be picking up her kids and they will be with her the whole weekend.  Discussed safety options at length with pt.  Pt is able to contract for safety.  R:  Pt receptive.        Chestine Spore, RITA, M.Ed, CNA

## 2016-07-06 ENCOUNTER — Other Ambulatory Visit (HOSPITAL_COMMUNITY): Payer: BC Managed Care – PPO

## 2016-07-07 ENCOUNTER — Other Ambulatory Visit (HOSPITAL_COMMUNITY): Payer: BC Managed Care – PPO | Admitting: Psychiatry

## 2016-07-07 DIAGNOSIS — F332 Major depressive disorder, recurrent severe without psychotic features: Secondary | ICD-10-CM | POA: Diagnosis not present

## 2016-07-07 MED ORDER — ARIPIPRAZOLE 2 MG PO TABS: 2.0000 mg | ORAL_TABLET | Freq: Every day | ORAL | 1 refills | Status: AC

## 2016-07-07 MED ORDER — BUPROPION HCL ER (XL) 300 MG PO TB24: 300.0000 mg | ORAL_TABLET | ORAL | 2 refills | Status: AC

## 2016-07-07 NOTE — Progress Notes (Signed)
Suzanne Hanson is a 41 y.o. ,separated, employed, Caucasian female, who [facility].  According to pt, she has had three admissions within seven months due to worsening depressive symptoms with SI.  Discussed safety options with pt at length.  Pt was able to contract for safety.  Background history of MDD recurrent. Recently presented to the ER with worsening depression and increasing thoughts of suicide. Reports that she has been preoccupied with thoughts of going into the woods and slit her carotids open.  Triggers/Stressors:  1)  Unresolved grief/loss issues:  recently separated (October 2017) from wife of seventeen yrs.  "My new lifestyle is more stressful."  Parents bought her a house seven minutes away from her ex-wife.  Custody of the kids is being rotated every two to three days.  Stated the kids are coping well.  "It has been difficult dealing with mutual friends that work with my ex-wife.  I miss those friends.  Only ~ two have reached out to me."  Pt has been so down and has not been able to make new friends herself.  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  "My job isn't rewarding at all like the job I had at [employer]."  Pt is currenly looking into other options.  Pt currenlty sees Jake SeatsLisa Poulus, NP and Kristin Bruinsathy Shoffety, APRN on an outpt basis.  Family Hx:  Sister  (depression and anxiety); Mother (Dysthymia)    Pt completed MH-IOP today.  C/O continued "ups and downs."  Denies SI/HI or A/V hallucinations.  Will return to work fulltime (35 hours).  Pt voiced that she has a phone interview for a job tomorrow.  A:  D/C today.  F/U with Jake SeatsLisa Poulus, NP mid March and [facility] in two weeks.  Encouraged support groups.  R:  Pt receptive.       Chestine SporeLARK, RITA, M.Ed, CNA

## 2016-07-07 NOTE — Progress Notes (Signed)
    Daily Group Progress Note  Program: IOP  Group Time: 9:00-12:00  Participation Level: Active  Behavioral Response: Appropriate  Type of Therapy:  Group Therapy  Summary of Progress: Pt. Presented with brightened affect, calm, significant improvement compared to last group. Pt. Met with case manager and psychiatrist for discharge. Pt. Reported that she had her children for the weekend and that her depression was minimal. Pt. Shared that she was looking forward to telephone interview and was hopeful about moving forward in her life and career.      Nancie Neas, LPC

## 2016-07-07 NOTE — Progress Notes (Signed)
BH IOP DISCHARGE NOTE  Patient:  Suzanne Hanson DOB:  1975/06/11  Date of Admission: 06/02/2016  Date of Discharge: 07/07/2016  Reason for Admission:depression  IOP Course:attended and participated.. Feels better overall she says but still has ups and downs wishing they were all ups.  Will increase the buproprion to 300 mg to see if that helps  Mental Status at Discharge:no suicidal thoughts but in her down episodes suicidal thoughts can return.  Diagnosis: major depression, recurrent, severe without psychosis  Level of Care:  IOP  Discharge destination:has appointments with her psychiatrist and therapist    Comments:  none  The patient received suicide prevention pamphlet:  Yes   Carolanne GrumblingGerald Taylor, MD

## 2016-07-07 NOTE — Progress Notes (Signed)
    Daily Group Progress Note  Program: IOP Group Time: 9:00-12:00   Participation Level:  active   Behavioral Response: responsive   Type of Therapy:   group therapy  Summary of Progress: Pt. was very tearful in group and shared that she feels she's become very depressed again. She dreamed of having a nice dinner with some friends she feels have abandoned her since she left her wife. This was extremely distressing to her, and had triggered some suicidal thoughts. Pt. visited with the social worker who did a crisis assessment and determined she was safe to go home and take care of her children on her own over the weekend. Pt. Participated in grief and loss with the Chaplain.   Shaune PollackBrown, Jennifer B, LPC

## 2016-07-07 NOTE — Patient Instructions (Signed)
D:  Pt completed MH-IOP today.  A:  Follow up with Kristin Bruinsathy Shoffety, APRN in two weeks and Jake SeatsLisa Poulus, NP mid March.  Return to work as directed.  R:  Pt receptive.

## 2016-07-08 ENCOUNTER — Other Ambulatory Visit (HOSPITAL_COMMUNITY): Payer: BC Managed Care – PPO

## 2016-07-09 ENCOUNTER — Other Ambulatory Visit (HOSPITAL_COMMUNITY): Payer: BC Managed Care – PPO

## 2016-07-10 ENCOUNTER — Other Ambulatory Visit (HOSPITAL_COMMUNITY): Payer: BC Managed Care – PPO

## 2016-07-13 ENCOUNTER — Other Ambulatory Visit (HOSPITAL_COMMUNITY): Payer: BC Managed Care – PPO

## 2016-07-14 ENCOUNTER — Other Ambulatory Visit (HOSPITAL_COMMUNITY): Payer: BC Managed Care – PPO

## 2016-07-15 ENCOUNTER — Other Ambulatory Visit (HOSPITAL_COMMUNITY): Payer: BC Managed Care – PPO

## 2016-07-16 ENCOUNTER — Other Ambulatory Visit (HOSPITAL_COMMUNITY): Payer: BC Managed Care – PPO

## 2016-07-17 ENCOUNTER — Other Ambulatory Visit (HOSPITAL_COMMUNITY): Payer: BC Managed Care – PPO

## 2016-07-20 ENCOUNTER — Other Ambulatory Visit (HOSPITAL_COMMUNITY): Payer: BC Managed Care – PPO

## 2016-07-21 ENCOUNTER — Other Ambulatory Visit (HOSPITAL_COMMUNITY): Payer: BC Managed Care – PPO

## 2016-07-22 ENCOUNTER — Other Ambulatory Visit (HOSPITAL_COMMUNITY): Payer: BC Managed Care – PPO

## 2016-07-23 ENCOUNTER — Other Ambulatory Visit (HOSPITAL_COMMUNITY): Payer: BC Managed Care – PPO

## 2016-07-24 ENCOUNTER — Other Ambulatory Visit (HOSPITAL_COMMUNITY): Payer: BC Managed Care – PPO

## 2016-07-27 ENCOUNTER — Other Ambulatory Visit (HOSPITAL_COMMUNITY): Payer: BC Managed Care – PPO

## 2017-07-27 ENCOUNTER — Other Ambulatory Visit: Payer: Self-pay | Admitting: Family Medicine

## 2017-07-27 DIAGNOSIS — Z1231 Encounter for screening mammogram for malignant neoplasm of breast: Secondary | ICD-10-CM

## 2017-08-18 ENCOUNTER — Ambulatory Visit
Admission: RE | Admit: 2017-08-18 | Discharge: 2017-08-18 | Disposition: A | Discharge status: Home / Self Care | Payer: Managed Care, Other (non HMO) | Source: Ambulatory Visit | Attending: Family Medicine | Admitting: Family Medicine

## 2017-08-18 DIAGNOSIS — Z1231 Encounter for screening mammogram for malignant neoplasm of breast: Secondary | ICD-10-CM

## 2019-01-26 ENCOUNTER — Other Ambulatory Visit: Payer: Self-pay

## 2019-01-26 DIAGNOSIS — Z20822 Contact with and (suspected) exposure to covid-19: Secondary | ICD-10-CM

## 2019-01-28 LAB — NOVEL CORONAVIRUS, NAA: SARS-CoV-2, NAA: NOT DETECTED

## 2019-02-13 ENCOUNTER — Other Ambulatory Visit: Payer: Self-pay | Admitting: Family Medicine

## 2019-02-13 DIAGNOSIS — Z1231 Encounter for screening mammogram for malignant neoplasm of breast: Secondary | ICD-10-CM

## 2019-04-27 ENCOUNTER — Other Ambulatory Visit: Payer: Self-pay

## 2019-04-27 ENCOUNTER — Ambulatory Visit
Admission: RE | Admit: 2019-04-27 | Discharge: 2019-04-27 | Disposition: A | Discharge status: Home / Self Care | Payer: Managed Care, Other (non HMO) | Source: Ambulatory Visit | Attending: Family Medicine | Admitting: Family Medicine

## 2019-04-27 DIAGNOSIS — Z1231 Encounter for screening mammogram for malignant neoplasm of breast: Secondary | ICD-10-CM

## 2019-06-30 ENCOUNTER — Other Ambulatory Visit: Payer: Self-pay

## 2019-06-30 ENCOUNTER — Other Ambulatory Visit: Payer: Self-pay | Admitting: Family Medicine

## 2019-06-30 ENCOUNTER — Ambulatory Visit
Admission: RE | Admit: 2019-06-30 | Discharge: 2019-06-30 | Disposition: A | Discharge status: Home / Self Care | Payer: Managed Care, Other (non HMO) | Source: Ambulatory Visit | Attending: Family Medicine | Admitting: Family Medicine

## 2019-06-30 DIAGNOSIS — R7611 Nonspecific reaction to tuberculin skin test without active tuberculosis: Secondary | ICD-10-CM

## 2019-07-02 ENCOUNTER — Emergency Department (HOSPITAL_COMMUNITY)
Admission: EM | Admit: 2019-07-02 | Discharge: 2019-07-03 | Disposition: A | Discharge status: Other Acute Inpatient Hospital | Payer: Managed Care, Other (non HMO) | Attending: Emergency Medicine | Admitting: Emergency Medicine

## 2019-07-02 ENCOUNTER — Encounter (HOSPITAL_COMMUNITY): Payer: Self-pay | Admitting: Emergency Medicine

## 2019-07-02 ENCOUNTER — Other Ambulatory Visit: Payer: Self-pay

## 2019-07-02 DIAGNOSIS — Z20822 Contact with and (suspected) exposure to covid-19: Secondary | ICD-10-CM | POA: Insufficient documentation

## 2019-07-02 DIAGNOSIS — R45851 Suicidal ideations: Secondary | ICD-10-CM | POA: Diagnosis not present

## 2019-07-02 DIAGNOSIS — Z79899 Other long term (current) drug therapy: Secondary | ICD-10-CM | POA: Diagnosis not present

## 2019-07-02 DIAGNOSIS — Z046 Encounter for general psychiatric examination, requested by authority: Secondary | ICD-10-CM | POA: Diagnosis not present

## 2019-07-02 DIAGNOSIS — Z7984 Long term (current) use of oral hypoglycemic drugs: Secondary | ICD-10-CM | POA: Insufficient documentation

## 2019-07-02 DIAGNOSIS — F329 Major depressive disorder, single episode, unspecified: Secondary | ICD-10-CM | POA: Diagnosis present

## 2019-07-02 DIAGNOSIS — F332 Major depressive disorder, recurrent severe without psychotic features: Secondary | ICD-10-CM | POA: Insufficient documentation

## 2019-07-02 LAB — CBC
HCT: 44.9 % (ref 36.0–46.0)
Hemoglobin: 14.8 g/dL (ref 12.0–15.0)
MCH: 30.5 pg (ref 26.0–34.0)
MCHC: 33 g/dL (ref 30.0–36.0)
MCV: 92.6 fL (ref 80.0–100.0)
Platelets: 367 10*3/uL (ref 150–400)
RBC: 4.85 MIL/uL (ref 3.87–5.11)
RDW: 12 % (ref 11.5–15.5)
WBC: 7.3 10*3/uL (ref 4.0–10.5)
nRBC: 0 % (ref 0.0–0.2)

## 2019-07-02 LAB — COMPREHENSIVE METABOLIC PANEL
ALT: 25 U/L (ref 0–44)
AST: 22 U/L (ref 15–41)
Albumin: 4.7 g/dL (ref 3.5–5.0)
Alkaline Phosphatase: 52 U/L (ref 38–126)
Anion gap: 10 (ref 5–15)
BUN: 17 mg/dL (ref 6–20)
CO2: 24 mmol/L (ref 22–32)
Calcium: 9.5 mg/dL (ref 8.9–10.3)
Chloride: 105 mmol/L (ref 98–111)
Creatinine, Ser: 0.78 mg/dL (ref 0.44–1.00)
GFR calc Af Amer: >60 mL/min (ref >60)
GFR calc non Af Amer: >60 mL/min (ref >60)
Glucose, Bld: 148 mg/dL — ABNORMAL HIGH (ref 70–99)
Potassium: 4.7 mmol/L (ref 3.5–5.1)
Sodium: 139 mmol/L (ref 135–145)
Total Bilirubin: 0.4 mg/dL (ref 0.3–1.2)
Total Protein: 7.9 g/dL (ref 6.5–8.1)

## 2019-07-02 LAB — I-STAT BETA HCG BLOOD, ED (MC, WL, AP ONLY): I-stat hCG, quantitative: <5 m[IU]/mL (ref <5)

## 2019-07-02 LAB — RESPIRATORY PANEL BY RT PCR (FLU A&B, COVID)
Influenza A by PCR: NEGATIVE
Influenza B by PCR: NEGATIVE
SARS Coronavirus 2 by RT PCR: NEGATIVE

## 2019-07-02 LAB — RAPID URINE DRUG SCREEN, HOSP PERFORMED
Amphetamines: POSITIVE — AB
Barbiturates: NOT DETECTED
Benzodiazepines: NOT DETECTED
Cocaine: NOT DETECTED
Opiates: NOT DETECTED
Tetrahydrocannabinol: NOT DETECTED

## 2019-07-02 LAB — ETHANOL: Alcohol, Ethyl (B): <10 mg/dL (ref <10)

## 2019-07-02 LAB — ACETAMINOPHEN LEVEL: Acetaminophen (Tylenol), Serum: <10 ug/mL — ABNORMAL LOW (ref 10–30)

## 2019-07-02 LAB — SALICYLATE LEVEL: Salicylate Lvl: <7 mg/dL — ABNORMAL LOW (ref 7.0–30.0)

## 2019-07-02 MED ORDER — HYDROXYZINE HCL 25 MG PO TABS: 25.0000 mg | ORAL_TABLET | Freq: Every evening | ORAL | Status: DC | PRN

## 2019-07-02 MED ORDER — METFORMIN HCL 500 MG PO TABS
500.0000 mg | ORAL_TABLET | Freq: Every day | ORAL | Status: DC
  Administered 2019-07-03: 500 mg via ORAL
  Filled 2019-07-02: qty 1

## 2019-07-02 MED ORDER — ARIPIPRAZOLE 2 MG PO TABS
2.0000 mg | ORAL_TABLET | Freq: Every day | ORAL | Status: DC
  Filled 2019-07-02 (×2): qty 1

## 2019-07-02 MED ORDER — CITALOPRAM HYDROBROMIDE 10 MG PO TABS
40.0000 mg | ORAL_TABLET | Freq: Every evening | ORAL | Status: DC
  Administered 2019-07-03: 40 mg via ORAL
  Filled 2019-07-02 (×2): qty 4

## 2019-07-02 MED ORDER — ONDANSETRON 8 MG PO TBDP: 8.0000 mg | ORAL_TABLET | Freq: Three times a day (TID) | ORAL | Status: DC | PRN

## 2019-07-02 MED ORDER — BUPROPION HCL ER (XL) 150 MG PO TB24
300.0000 mg | ORAL_TABLET | ORAL | Status: DC
  Filled 2019-07-02: qty 2

## 2019-07-02 MED ORDER — LISDEXAMFETAMINE DIMESYLATE 30 MG PO CAPS
30.0000 mg | ORAL_CAPSULE | Freq: Every day | ORAL | Status: DC
  Administered 2019-07-03: 30 mg via ORAL
  Filled 2019-07-02: qty 1

## 2019-07-02 MED ORDER — NORETHINDRONE 0.35 MG PO TABS: 0.3500 mg | ORAL_TABLET | Freq: Every day | ORAL | Status: DC

## 2019-07-02 NOTE — ED Provider Notes (Signed)
[place] DEPT Provider Note   CSN: [id number] Arrival date & time: 07/02/19  1047     History Chief Complaint  Patient presents with  . Suicidal    Suzanne Hanson is a 44 y.o. female.  HPI   Pt is a 78 y/o F with a h/o allergies, anxiety/depression, prediabetes, who presents to the ED for eval of SI.  Patient states that she feels hopeless for the last few days.  She recently lost her job and states that her ex-wife plans on moving to [place] with their 3 children.  She feels frustrated at having to choose her employment based on where her ex-wife is living and this has caused increased stress.  She has had a history of suicidal ideation and states that she has always had the same plan.  She plans to hike into the woods and cut herself in her carotid artery.  She notes that she usually tries to destress by hiking and camping but she has not been able to do this because she does not trust herself not to harm her self.  She denies drug use.  She reports alcohol use rarely.  Denies HI or AVH.  She has been compliant with her psychiatric medications.  She denies any medical complaints.  Past Medical History:  Diagnosis Date  . Allergy   . Anxiety   . Depression   . Prediabetes     Patient Active Problem List   Diagnosis Date Noted  . Major depressive disorder, recurrent severe without psychotic features ([place]) 05/26/2016  . MDD (major depressive disorder), recurrent severe, without psychosis ([place]) 10/05/2015    Past Surgical History:  Procedure Laterality Date  . BUNIONECTOMY       OB History    Gravida  1   Para      Term      Preterm      AB      Living        SAB      TAB      Ectopic      Multiple      Live Births              Family History  Problem Relation Age of Onset  . Osteoporosis Mother   . Post-traumatic stress disorder Father   . Drug abuse Sister   . Depression Sister   . Anxiety disorder Sister    . Epilepsy Brother   . Food Allergy Son   . Arthritis Maternal Grandmother   . Osteoporosis Maternal Grandmother   . Graves' disease Maternal Grandfather   . Cancer Maternal Grandfather   . Diabetes Paternal Grandfather   . Cancer Paternal Grandfather     Social History   Tobacco Use  . Smoking status: Never Smoker  . Smokeless tobacco: Never Used  Substance Use Topics  . Alcohol use: No  . Drug use: No    Home Medications Prior to Admission medications   Medication Sig Start Date End Date Taking? Authorizing Provider  ARIPiprazole (ABILIFY) 2 MG tablet Take 1 tablet (2 mg total) by mouth daily. 07/07/16   Clarene Reamer, MD  buPROPion (WELLBUTRIN XL) 300 MG 24 hr tablet Take 1 tablet (300 mg total) by mouth every morning. 07/07/16 07/07/17  Clarene Reamer, MD  citalopram (CELEXA) 40 MG tablet Take 1 tablet (40 mg total) by mouth every evening. 05/31/16   Kerrie Buffalo, NP  hydrOXYzine (ATARAX/VISTARIL) 25 MG tablet Take 1 tablet (  25 mg total) by mouth at bedtime as needed for anxiety. 05/31/16   Adonis Brook, NP  metFORMIN (GLUCOPHAGE) 500 MG tablet Take 500 mg by mouth daily. 05/29/19   [provider]  norethindrone (MICRONOR) 0.35 MG tablet Take 0.35 mg by mouth daily. 06/09/19   [provider]  ondansetron (ZOFRAN-ODT) 4 MG disintegrating tablet Take 8 mg by mouth every 8 (eight) hours as needed. 06/16/19   [provider]  VYVANSE 30 MG capsule Take 30 mg by mouth daily. 06/09/19   [provider]    Allergies    Patient has no known allergies.  Review of Systems   Review of Systems  Constitutional: Negative for fever.  HENT: Negative for ear pain and sore throat.   Eyes: Negative for visual disturbance.  Respiratory: Negative for cough and shortness of breath.   Cardiovascular: Negative for chest pain.  Gastrointestinal: Negative for abdominal pain, constipation, diarrhea, nausea and vomiting.  Genitourinary: Negative for  dysuria and hematuria.  Musculoskeletal: Negative for back pain.  Skin: Negative for rash.  Neurological: Negative for headaches.  Psychiatric/Behavioral: Positive for dysphoric mood.       SI with plan. No HI or AVH.  All other systems reviewed and are negative.   Physical Exam Updated Vital Signs BP (!) 164/99 (BP Location: Right Arm)   Pulse 94   Temp 98.9 F (37.2 C) (Oral)   Resp 19   SpO2 100%   Physical Exam Vitals and nursing note reviewed.  Constitutional:      General: She is not in acute distress.    Appearance: She is well-developed.  HENT:     Head: Normocephalic and atraumatic.  Eyes:     Conjunctiva/sclera: Conjunctivae normal.  Cardiovascular:     Rate and Rhythm: Normal rate and regular rhythm.     Heart sounds: No murmur.  Pulmonary:     Effort: Pulmonary effort is normal. No respiratory distress.     Breath sounds: Normal breath sounds.  Abdominal:     Palpations: Abdomen is soft.     Tenderness: There is no abdominal tenderness.  Musculoskeletal:     Cervical back: Neck supple.  Skin:    General: Skin is warm and dry.  Neurological:     Mental Status: She is alert.  Psychiatric:        Attention and Perception: Attention normal.        Mood and Affect: Mood is depressed.        Speech: Speech normal.        Behavior: Behavior normal.        Thought Content: Thought content includes suicidal ideation. Thought content does not include homicidal ideation. Thought content includes suicidal plan. Thought content does not include homicidal plan.     Comments: Tearful    ED Results / Procedures / Treatments   Labs (all labs ordered are listed, but only abnormal results are displayed) Labs Reviewed  COMPREHENSIVE METABOLIC PANEL  ETHANOL  SALICYLATE LEVEL  ACETAMINOPHEN LEVEL  CBC  RAPID URINE DRUG SCREEN, HOSP PERFORMED  I-STAT BETA HCG BLOOD, ED ([facility], [facility], [facility] ONLY)    EKG None  Radiology DG Chest 1 View  Result Date:  06/30/2019 CLINICAL DATA:  Positive TB test. EXAM: CHEST  1 VIEW COMPARISON:  None. FINDINGS: Lungs clear. Heart size normal. No pneumothorax or pleural fluid. No bony abnormality. IMPRESSION: Normal chest. Electronically Signed   By: Drusilla Kanner M.D.   On: 06/30/2019 13:56  Procedures Procedures (including critical care time)  Medications Ordered in ED Medications - No data to display  ED Course  I have reviewed the triage vital signs and the nursing notes.  Pertinent labs & imaging results that were available during my care of the patient were reviewed by me and considered in my medical decision making (see chart for details).    MDM Rules/Calculators/A&P                      44 year old female presenting for evaluation of suicidal ideation.  Expresses hopelessness due to issues with her ex wife and recent job loss.  Has plan to harm self by cutting her carotid artery.  No medical complaints.  Reviewed labs CBC nonacute CMP nonacute EtOH neg Acetaminophen neg  Salicylate neg U preg neg UDS + for amphetamines - pt on vyvanse  Pt w/o an acute medical condition that would require further w/u or admission. She is appropriate for TTS eval.   [facility] evaluated the patient and recommended inpatient tx. COVID testing has been ordered. Home meds have been ordered.   The patient has been placed in psychiatric observation due to the need to provide a safe environment for the patient while obtaining psychiatric consultation and evaluation, as well as ongoing medical and medication management to treat the patient's condition.  The patient has not been placed under full IVC at this time.  Final Clinical Impression(s) / ED Diagnoses Final diagnoses:  None    Rx / DC Orders ED Discharge Orders    None       Rayne Du 07/02/19 2021    Mancel Bale, MD 07/05/19 [phone]

## 2019-07-02 NOTE — Progress Notes (Addendum)
Pt has tentatively been accepted to [facility] pending negative COVID results.  COVID results will need to be faxed to [phone] once completed. Admissions will provide accepting information once results have been faxed.  16:21 CSW called [facility] to inform them not to hold the bed for pt due to pt being place elsewhere.   Ruthann Cancer MSW, LCSWA Clincal Social Worker Disposition  [facility] Ph: [phone] Fax: [phone]
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
## 2019-07-02 NOTE — ED Notes (Signed)
Pt to room 37. Pt alert, calm, cooperative, no s/s of distress. Pt oriented to unit. Pt laying in bed resting comfortably

## 2019-07-02 NOTE — ED Triage Notes (Addendum)
Patient here from home with complaints of suicidal ideation that started 2 days ago, reports life changes. Plan to OD. Reports hx of same. Psychiatrist. On meds, not helping.

## 2019-07-02 NOTE — Progress Notes (Signed)
Received Suzanne Hanson at he change of shift asleep in her room. She was cooperative with her VS being monitored. She slept throughout the night without incident.

## 2019-07-02 NOTE — Progress Notes (Signed)
Patient meets criteria for inpatient treatment per Malachy Chamber, NP. No appropriate beds at [facility] currently. CSW faxed referrals to the following facilities for review:   [facility]   [facility]  [facility]   [facility]   [facility]   [facility]   [facility]   [facility]   [facility]  [facility]   [facility]  [facility]   [facility]   [facility]   TTS will continue to seek bed placement.     Ruthann Cancer MSW, [facility] Social Worker Disposition  [facility] Ph: [phone] Fax: [phone] 07/02/2019 3:29 PM

## 2019-07-02 NOTE — Progress Notes (Signed)
Pt accepted to [facility] Estill Cotta is the accepting provider.  Call report to [phone] [facility], RN @ [facility] ED notified.   Pt is  Voluntary Pt may be transported by [employer] Pt scheduled to arrive at [facility] anytime after 10am on 07/03/19   Ruthann Cancer MSW, [facility] Clincal Social Worker Disposition  [facility] Ph: [phone] Fax: [phone]  07/02/2019 4:06 PM

## 2019-07-03 NOTE — ED Provider Notes (Signed)
9:54 AM Behavioral health team was called to request to evaluate patient as they are concerned that she needs involuntary commitment.  They report that they feel she is a threat to herself and has threatened to perform cutting and attempt to kill herself.  They report that they found placement for her but she says she does not want to go.  I assessed patient and outlined that the mental health team is extremely concerned about her and if she does not want to voluntarily be admitted and get the mental health that she needs, we would need to fill out involuntary commitment paperwork to IVC her.  Patient says that she is willing to go voluntarily now and we will hold on IVC at this time.  If patient decides she does not want to go, we will perform IVC as the psychiatry team is very concerned about her safety.   Jamorris Ndiaye, Canary Brim, MD 07/03/19 [phone]

## 2019-07-03 NOTE — ED Notes (Signed)
Patient is reluctant to agree to going to [facility].  She appears anxious and depressed.

## 2019-07-03 NOTE — ED Notes (Signed)
Patient discharged safely with [facility].  All belongings were returned to patient.

## 2019-07-03 NOTE — Discharge Summary (Signed)
  Patient to be transferred to [place] [facility] for inpatient psychiatric treatment 

## 2019-07-13 ENCOUNTER — Telehealth (HOSPITAL_COMMUNITY): Payer: Self-pay | Admitting: Psychiatry

## 2019-07-13 NOTE — Telephone Encounter (Signed)
Cln called pt again, Pt reports she did IOP in '18; Pt had inpt stay at [facility] last week; "it was horrific." Pt reports a lot of stressors: custody, divorce, job loss. Pt reports "I want to process all of this." Cln orients pt to PHP. Pt is concerned she already has solid coping skills and wants to focus more on processing individual information. Cln explains that would be individual counseling; pt reports she sees a therapist 1x every 3 weeks but feels it's not enough. Pt wants to discuss PHP with psychiatrist and therapist and will reach back out to this cln if wants to schedule assessment for PHP.

## 2019-07-13 NOTE — Telephone Encounter (Signed)
D:  Pt had phoned and left vm inquiring about MH-IOP; states her therapist told her to call.  A:  Placed call to pt, but there was no answer.  Left vm encouraging pt to contact PHP, since she was just discharged from [facility] last week.  Informed PHP.

## 2021-08-07 IMAGING — MG DIGITAL SCREENING BILAT W/ CAD
4 series · 4 of 4 positions shown · non-contrast
Comparison: Previous exam(s).

CLINICAL DATA: Screening.

Screening. Four
EXAM:
DIGITAL SCREENING BILATERAL MAMMOGRAM WITH CAD

[R MLO]
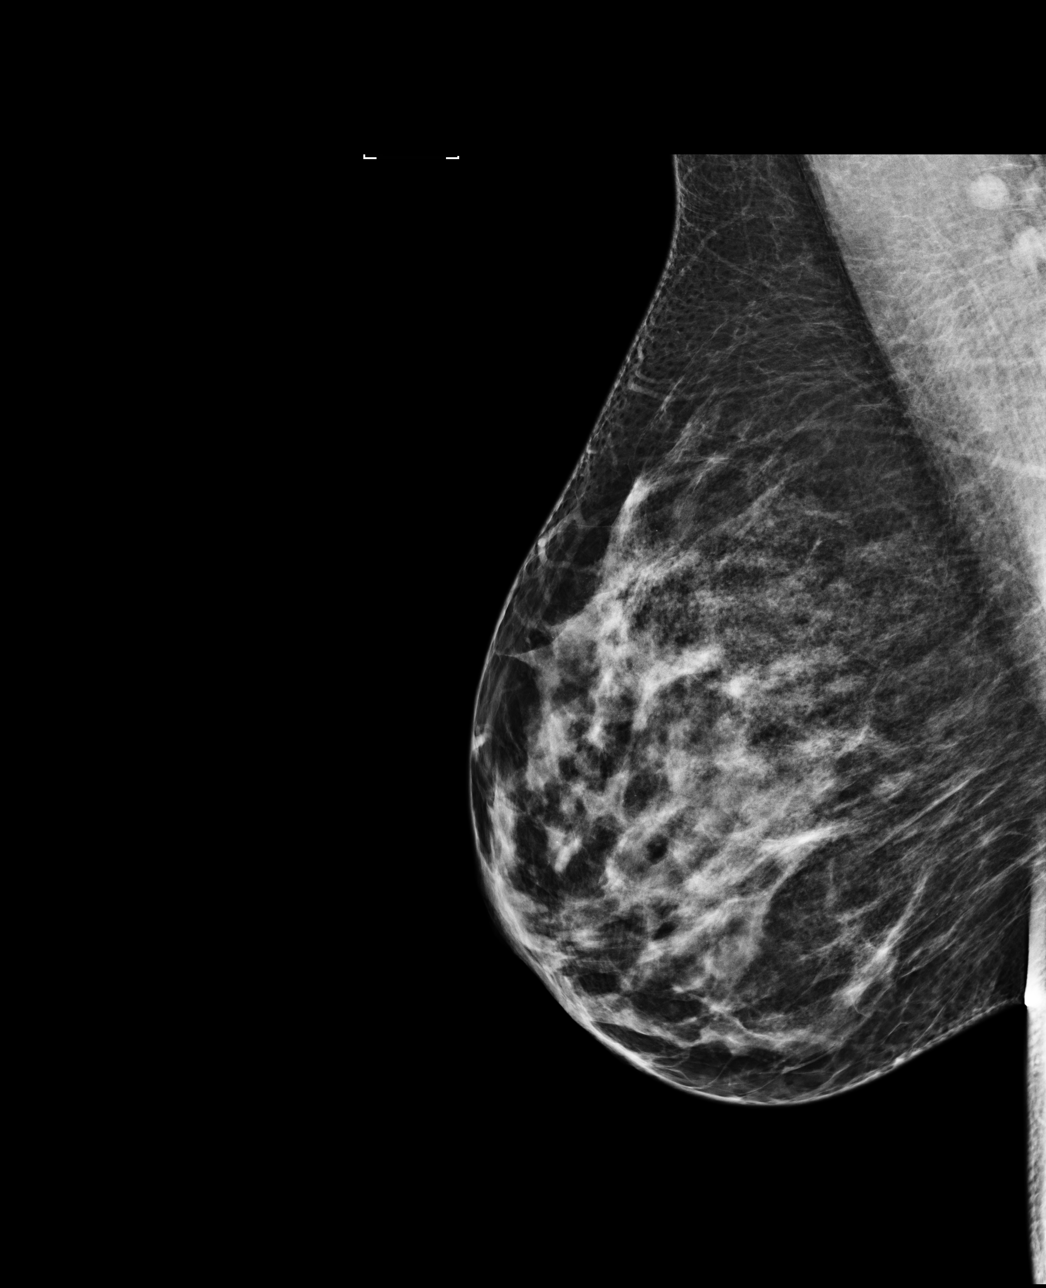

[L CC]
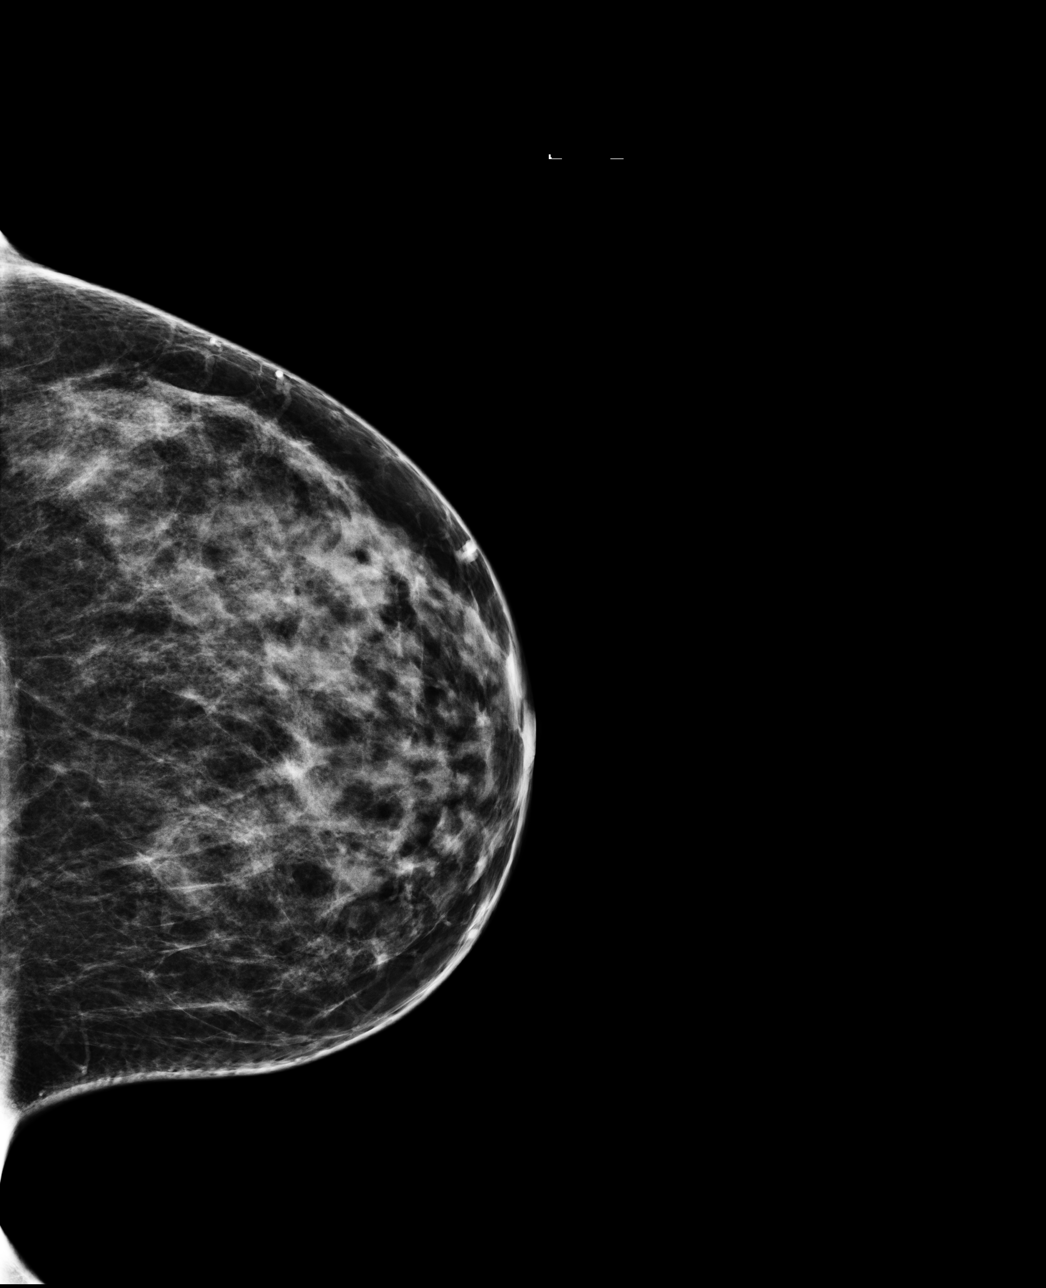

[L MLO]
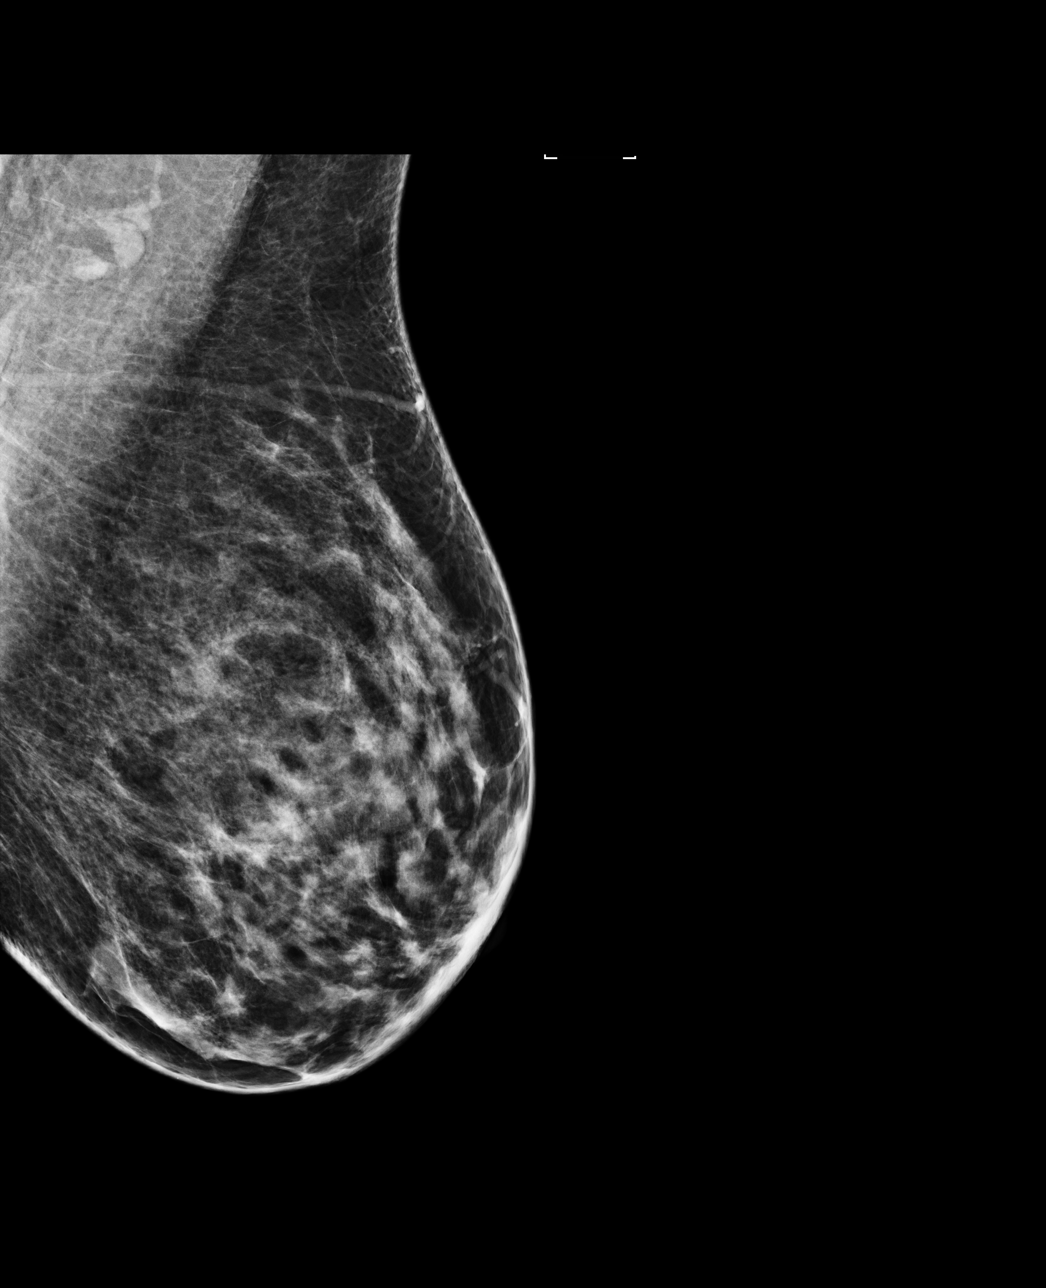

[R CC]
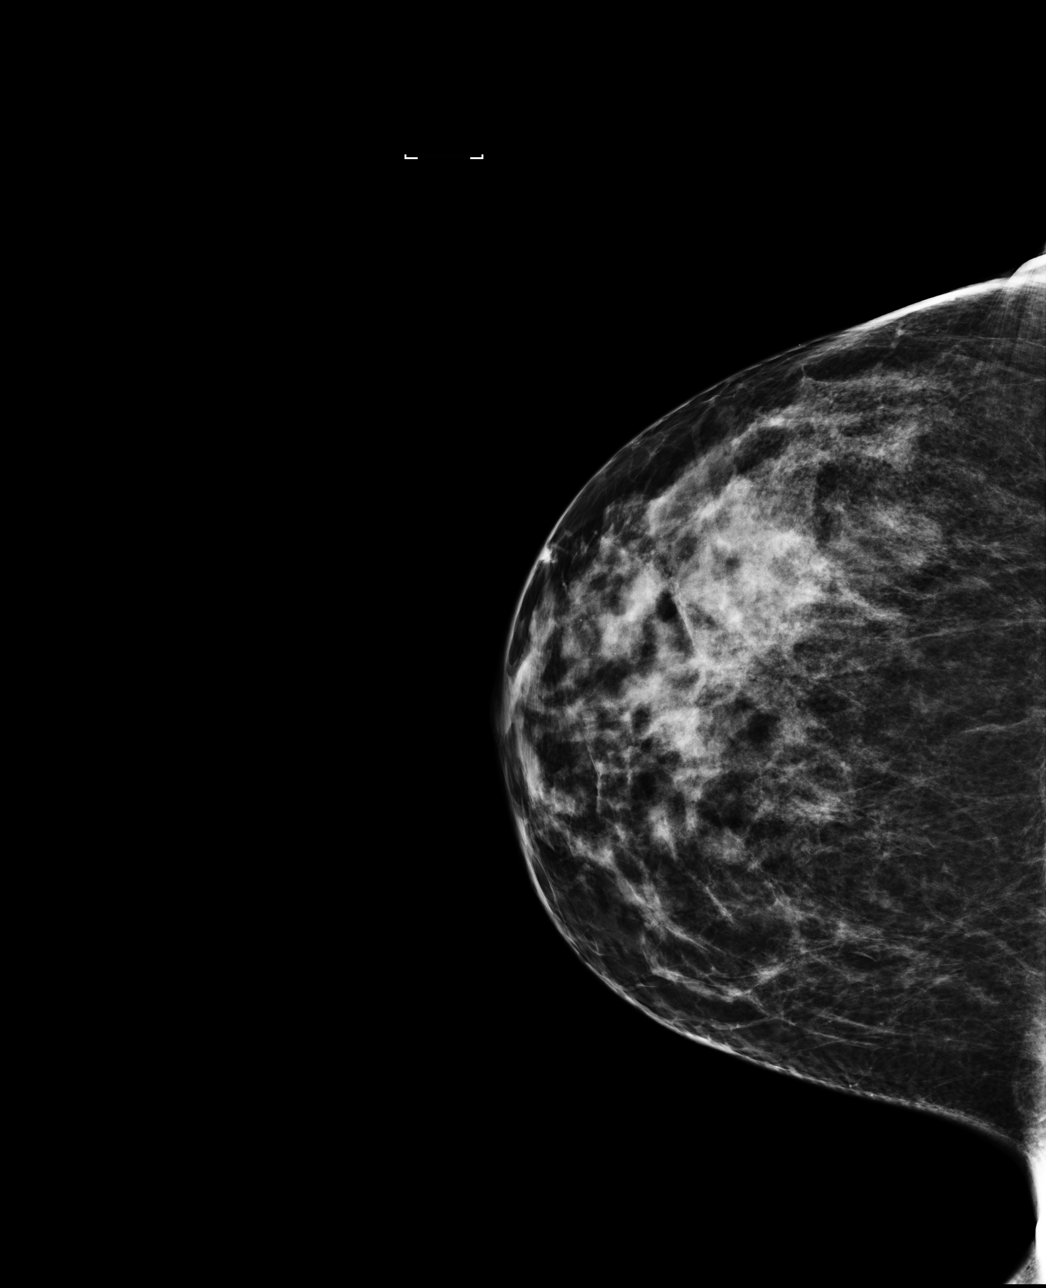

[4 of 4 positions shown; findings below may reference images not displayed]

ACR Breast Density Category c: The breast tissue is heterogeneously
dense, which may obscure small masses.
FINDINGS: There are no findings suspicious for malignancy. Images were
processed with CAD.
IMPRESSION: No mammographic evidence of malignancy. A result letter of this
screening mammogram will be mailed directly to the patient.

RECOMMENDATION:
Screening mammogram in one year. (Code:[id number])

BI-RADS CATEGORY  1: Negative.
